# Patient Record
Sex: Female | Born: 1960 | Race: White | Hispanic: No | Marital: Married | Smoking: Never smoker
Health system: Southern US, Community
[De-identification: ages and names within clinical notes are randomized; demographics above are authoritative.]

## PROBLEM LIST (undated history)

## (undated) DIAGNOSIS — F329 Major depressive disorder, single episode, unspecified: Secondary | ICD-10-CM

## (undated) DIAGNOSIS — F419 Anxiety disorder, unspecified: Secondary | ICD-10-CM

## (undated) DIAGNOSIS — E039 Hypothyroidism, unspecified: Secondary | ICD-10-CM

## (undated) DIAGNOSIS — F32A Depression, unspecified: Secondary | ICD-10-CM

## (undated) HISTORY — DX: Anxiety disorder, unspecified: F41.9

## (undated) HISTORY — DX: Hypothyroidism, unspecified: E03.9

## (undated) HISTORY — DX: Major depressive disorder, single episode, unspecified: F32.9

## (undated) HISTORY — DX: Depression, unspecified: F32.A

---

## 1999-10-04 ENCOUNTER — Encounter: Admission: RE | Admit: 1999-10-04 | Discharge: 1999-10-04 | Payer: Self-pay | Admitting: *Deleted

## 1999-10-04 ENCOUNTER — Encounter: Payer: Self-pay | Admitting: *Deleted

## 2000-01-09 ENCOUNTER — Other Ambulatory Visit: Admission: RE | Admit: 2000-01-09 | Discharge: 2000-01-09 | Payer: Self-pay | Admitting: *Deleted

## 2002-12-16 ENCOUNTER — Encounter: Admission: RE | Admit: 2002-12-16 | Discharge: 2002-12-16 | Payer: Self-pay | Admitting: Women's Health

## 2002-12-16 ENCOUNTER — Encounter: Payer: Self-pay | Admitting: Obstetrics and Gynecology

## 2002-12-23 ENCOUNTER — Other Ambulatory Visit: Admission: RE | Admit: 2002-12-23 | Discharge: 2002-12-23 | Payer: Self-pay | Admitting: *Deleted

## 2003-12-22 ENCOUNTER — Encounter: Admission: RE | Admit: 2003-12-22 | Discharge: 2003-12-22 | Payer: Self-pay | Admitting: Obstetrics and Gynecology

## 2003-12-27 ENCOUNTER — Other Ambulatory Visit: Admission: RE | Admit: 2003-12-27 | Discharge: 2003-12-27 | Payer: Self-pay | Admitting: Gynecology

## 2005-01-03 ENCOUNTER — Encounter: Admission: RE | Admit: 2005-01-03 | Discharge: 2005-01-03 | Payer: Self-pay | Admitting: Obstetrics and Gynecology

## 2005-12-31 ENCOUNTER — Other Ambulatory Visit: Admission: RE | Admit: 2005-12-31 | Discharge: 2005-12-31 | Payer: Self-pay | Admitting: Gynecology

## 2006-01-07 ENCOUNTER — Encounter: Admission: RE | Admit: 2006-01-07 | Discharge: 2006-01-07 | Payer: Self-pay | Admitting: Obstetrics and Gynecology

## 2007-01-06 ENCOUNTER — Other Ambulatory Visit: Admission: RE | Admit: 2007-01-06 | Discharge: 2007-01-06 | Payer: Self-pay | Admitting: Gynecology

## 2007-01-23 ENCOUNTER — Encounter: Admission: RE | Admit: 2007-01-23 | Discharge: 2007-01-23 | Payer: Self-pay | Admitting: Obstetrics and Gynecology

## 2008-01-08 ENCOUNTER — Other Ambulatory Visit: Admission: RE | Admit: 2008-01-08 | Discharge: 2008-01-08 | Payer: Self-pay | Admitting: Gynecology

## 2008-01-26 ENCOUNTER — Encounter: Admission: RE | Admit: 2008-01-26 | Discharge: 2008-01-26 | Payer: Self-pay | Admitting: Obstetrics and Gynecology

## 2008-12-20 ENCOUNTER — Other Ambulatory Visit: Admission: RE | Admit: 2008-12-20 | Discharge: 2008-12-20 | Payer: Self-pay | Admitting: Gynecology

## 2009-01-20 ENCOUNTER — Encounter: Payer: Self-pay | Admitting: Women's Health

## 2009-01-20 ENCOUNTER — Ambulatory Visit: Payer: Self-pay | Admitting: Women's Health

## 2009-01-25 ENCOUNTER — Emergency Department (HOSPITAL_COMMUNITY): Admission: EM | Admit: 2009-01-25 | Discharge: 2009-01-26 | Payer: Self-pay | Admitting: Emergency Medicine

## 2009-03-21 ENCOUNTER — Encounter: Admission: RE | Admit: 2009-03-21 | Discharge: 2009-03-21 | Payer: Self-pay | Admitting: Obstetrics and Gynecology

## 2009-05-17 ENCOUNTER — Ambulatory Visit: Payer: Self-pay | Admitting: Women's Health

## 2009-07-26 ENCOUNTER — Ambulatory Visit: Payer: Self-pay | Admitting: Women's Health

## 2010-01-23 ENCOUNTER — Other Ambulatory Visit: Admission: RE | Admit: 2010-01-23 | Discharge: 2010-01-23 | Payer: Self-pay | Admitting: Gynecology

## 2010-01-23 ENCOUNTER — Ambulatory Visit: Payer: Self-pay | Admitting: Women's Health

## 2010-07-05 ENCOUNTER — Ambulatory Visit: Payer: Self-pay | Admitting: Women's Health

## 2011-02-05 ENCOUNTER — Other Ambulatory Visit (HOSPITAL_COMMUNITY)
Admission: RE | Admit: 2011-02-05 | Discharge: 2011-02-05 | Disposition: A | Discharge status: Home / Self Care | Payer: PRIVATE HEALTH INSURANCE | Source: Ambulatory Visit | Attending: Gynecology | Admitting: Gynecology

## 2011-02-05 ENCOUNTER — Other Ambulatory Visit: Payer: Self-pay | Admitting: Women's Health

## 2011-02-05 ENCOUNTER — Encounter (INDEPENDENT_AMBULATORY_CARE_PROVIDER_SITE_OTHER): Payer: PRIVATE HEALTH INSURANCE | Admitting: Women's Health

## 2011-02-05 DIAGNOSIS — R823 Hemoglobinuria: Secondary | ICD-10-CM

## 2011-02-05 DIAGNOSIS — Z01419 Encounter for gynecological examination (general) (routine) without abnormal findings: Secondary | ICD-10-CM

## 2011-02-05 DIAGNOSIS — Z833 Family history of diabetes mellitus: Secondary | ICD-10-CM

## 2011-02-05 DIAGNOSIS — Z13220 Encounter for screening for lipoid disorders: Secondary | ICD-10-CM

## 2011-02-05 DIAGNOSIS — E039 Hypothyroidism, unspecified: Secondary | ICD-10-CM

## 2011-02-05 DIAGNOSIS — Z124 Encounter for screening for malignant neoplasm of cervix: Secondary | ICD-10-CM | POA: Insufficient documentation

## 2011-04-03 LAB — URINE MICROSCOPIC-ADD ON

## 2011-04-03 LAB — DIFFERENTIAL
Eosinophils Relative: 3 % (ref 0–5)
Lymphocytes Relative: 19 % (ref 12–46)
Lymphs Abs: 1.5 10*3/uL (ref 0.7–4.0)
Monocytes Absolute: 0.5 10*3/uL (ref 0.1–1.0)
Monocytes Relative: 7 % (ref 3–12)

## 2011-04-03 LAB — POCT I-STAT, CHEM 8
BUN: 14 mg/dL (ref 6–23)
Creatinine, Ser: 0.9 mg/dL (ref 0.4–1.2)
Glucose, Bld: 145 mg/dL — ABNORMAL HIGH (ref 70–99)
Sodium: 139 mEq/L (ref 135–145)
TCO2: 24 mmol/L (ref 0–100)

## 2011-04-03 LAB — URINALYSIS, ROUTINE W REFLEX MICROSCOPIC
Glucose, UA: NEGATIVE mg/dL
Hgb urine dipstick: NEGATIVE
Protein, ur: 30 mg/dL — AB

## 2011-04-03 LAB — CBC
HCT: 38.1 % (ref 36.0–46.0)
Hemoglobin: 13.3 g/dL (ref 12.0–15.0)
Platelets: 199 10*3/uL (ref 150–400)
RBC: 4.06 MIL/uL (ref 3.87–5.11)
WBC: 8 10*3/uL (ref 4.0–10.5)

## 2011-04-03 LAB — POCT PREGNANCY, URINE: Preg Test, Ur: NEGATIVE

## 2012-01-31 DIAGNOSIS — F419 Anxiety disorder, unspecified: Secondary | ICD-10-CM | POA: Insufficient documentation

## 2012-01-31 DIAGNOSIS — F329 Major depressive disorder, single episode, unspecified: Secondary | ICD-10-CM | POA: Insufficient documentation

## 2012-01-31 DIAGNOSIS — E039 Hypothyroidism, unspecified: Secondary | ICD-10-CM | POA: Insufficient documentation

## 2012-02-07 ENCOUNTER — Other Ambulatory Visit (HOSPITAL_COMMUNITY)
Admission: RE | Admit: 2012-02-07 | Discharge: 2012-02-07 | Disposition: A | Discharge status: Home / Self Care | Payer: PRIVATE HEALTH INSURANCE | Source: Ambulatory Visit | Attending: Obstetrics and Gynecology | Admitting: Obstetrics and Gynecology

## 2012-02-07 ENCOUNTER — Other Ambulatory Visit: Payer: Self-pay | Admitting: Women's Health

## 2012-02-07 ENCOUNTER — Ambulatory Visit (INDEPENDENT_AMBULATORY_CARE_PROVIDER_SITE_OTHER): Payer: PRIVATE HEALTH INSURANCE | Admitting: Women's Health

## 2012-02-07 ENCOUNTER — Encounter: Payer: Self-pay | Admitting: Women's Health

## 2012-02-07 VITALS — BP 128/74 | Ht 64.5 in | Wt 173.0 lb

## 2012-02-07 DIAGNOSIS — Z780 Asymptomatic menopausal state: Secondary | ICD-10-CM

## 2012-02-07 DIAGNOSIS — Z01419 Encounter for gynecological examination (general) (routine) without abnormal findings: Secondary | ICD-10-CM | POA: Insufficient documentation

## 2012-02-07 DIAGNOSIS — E039 Hypothyroidism, unspecified: Secondary | ICD-10-CM

## 2012-02-07 DIAGNOSIS — Z13220 Encounter for screening for lipoid disorders: Secondary | ICD-10-CM

## 2012-02-07 DIAGNOSIS — Z833 Family history of diabetes mellitus: Secondary | ICD-10-CM

## 2012-02-07 DIAGNOSIS — F329 Major depressive disorder, single episode, unspecified: Secondary | ICD-10-CM

## 2012-02-07 LAB — CBC WITH DIFFERENTIAL/PLATELET
Basophils Absolute: 0 10*3/uL (ref 0.0–0.1)
Basophils Relative: 1 % (ref 0–1)
Eosinophils Absolute: 0.2 10*3/uL (ref 0.0–0.7)
Eosinophils Relative: 5 % (ref 0–5)
MCH: 31.4 pg (ref 26.0–34.0)
MCV: 97.9 fL (ref 78.0–100.0)
Platelets: 231 10*3/uL (ref 150–400)
RDW: 12.6 % (ref 11.5–15.5)
WBC: 5 10*3/uL (ref 4.0–10.5)

## 2012-02-07 LAB — URINALYSIS W MICROSCOPIC + REFLEX CULTURE
Casts: NONE SEEN
Glucose, UA: NEGATIVE mg/dL
Leukocytes, UA: NEGATIVE
Protein, ur: NEGATIVE mg/dL
pH: 5.5 (ref 5.0–8.0)

## 2012-02-07 LAB — TSH: TSH: 2.822 u[IU]/mL (ref 0.350–4.500)

## 2012-02-07 MED ORDER — FLUOXETINE HCL 20 MG PO CAPS: 20.0000 mg | ORAL_CAPSULE | Freq: Every day | ORAL | Status: DC

## 2012-02-07 MED ORDER — LEVOTHYROXINE SODIUM 112 MCG PO TABS: 112.0000 ug | ORAL_TABLET | Freq: Every day | ORAL | Status: DC

## 2012-02-07 NOTE — Progress Notes (Signed)
Donna Wheeler 25-Jun-1961 [id number]    History:    The patient presents for annual exam.  Postmenopausal with no bleeding or HRT. Hypothyroid on Synthroid 112 with stable TSH. History of anxiety/depression stable on Prozac 20 mg daily. History of normal paps and mammograms, overdue, last mammogram 2010.   Past medical history, past surgical history, family history and social history were all reviewed and documented in the EPIC chart.   ROS:  A  ROS was performed and pertinent positives and negatives are included in the history.  Exam:  Filed Vitals:   02/07/12 0804  BP: 128/74    General appearance:  Normal Head/Neck:  Normal, without cervical or supraclavicular adenopathy. Thyroid:  Symmetrical, normal in size, without palpable masses or nodularity. Respiratory  Effort:  Normal  Auscultation:  Clear without wheezing or rhonchi Cardiovascular  Auscultation:  Regular rate, without rubs, murmurs or gallops  Edema/varicosities:  Not grossly evident Abdominal  Soft,nontender, without masses, guarding or rebound.  Liver/spleen:  No organomegaly noted  Hernia:  None appreciated  Skin  Inspection:  Grossly normal  Palpation:  Grossly normal Neurologic/psychiatric  Orientation:  Normal with appropriate conversation.  Mood/affect:  Normal  Genitourinary    Breasts: Examined lying and sitting.     Right: Without masses, retractions, discharge or axillary adenopathy.     Left: Without masses, retractions, discharge or axillary adenopathy.   Inguinal/mons:  Normal without inguinal adenopathy  External genitalia:  Normal  BUS/Urethra/Skene's glands:  Normal  Bladder:  Normal  Vagina:  Normal  Cervix:  Normal  Uterus:   normal in size, shape and contour.  Midline and mobile  Adnexa/parametria:     Rt: Without masses or tenderness.   Lt: Without masses or tenderness.  Anus and perineum: Normal  Digital rectal exam: Normal sphincter tone without palpated masses or  tenderness  Assessment/Plan:  50 y.o.MWF G1P1  for annual exam with no complaints.  Normal postmenopausal exam with no bleeding or HRT Hypothyroid  Anxiety/depression stable Prozac 20 daily  Plan: Prozac 20 g by mouth daily prescription, proper use, need for regular exercise and leisure discussed. Declines need for counseling at this time. SBE's, schedule overdue mammogram ASAP, increased exercise, decrease calories for weight loss, vitamin D 2000 daily encouraged. Synthroid 112 mcg prescription, proper use, given and reviewed. DEXA, will schedule. CBC, glucose, lipid profile, UA and Pap    Harrington Challenger [facility], 8:43 AM 02/07/2012

## 2012-02-08 LAB — URINE CULTURE

## 2012-02-12 ENCOUNTER — Telehealth: Payer: Self-pay | Admitting: *Deleted

## 2012-02-12 MED ORDER — LEVOTHYROXINE SODIUM 112 MCG PO TABS: 112.0000 ug | ORAL_TABLET | Freq: Every day | ORAL | Status: DC

## 2012-02-12 NOTE — Telephone Encounter (Signed)
Pt called stating she only takes brand synthroid 112 mcg only. Pharmacy tried to give pt generic brand medication. New rx sent to [facility] for brand only. Pt informed as well.

## 2012-02-13 ENCOUNTER — Other Ambulatory Visit: Payer: Self-pay

## 2012-02-13 DIAGNOSIS — E7800 Pure hypercholesterolemia, unspecified: Secondary | ICD-10-CM

## 2012-03-03 ENCOUNTER — Other Ambulatory Visit: Payer: Self-pay | Admitting: Obstetrics and Gynecology

## 2012-03-03 DIAGNOSIS — Z1231 Encounter for screening mammogram for malignant neoplasm of breast: Secondary | ICD-10-CM

## 2012-03-11 ENCOUNTER — Ambulatory Visit
Admission: RE | Admit: 2012-03-11 | Discharge: 2012-03-11 | Disposition: A | Discharge status: Home / Self Care | Payer: PRIVATE HEALTH INSURANCE | Source: Ambulatory Visit | Attending: Obstetrics and Gynecology | Admitting: Obstetrics and Gynecology

## 2012-03-11 DIAGNOSIS — Z1231 Encounter for screening mammogram for malignant neoplasm of breast: Secondary | ICD-10-CM

## 2013-02-25 ENCOUNTER — Ambulatory Visit (INDEPENDENT_AMBULATORY_CARE_PROVIDER_SITE_OTHER): Payer: PRIVATE HEALTH INSURANCE | Admitting: Women's Health

## 2013-02-25 ENCOUNTER — Encounter: Payer: Self-pay | Admitting: Women's Health

## 2013-02-25 VITALS — BP 124/80 | Ht 65.0 in | Wt 182.0 lb

## 2013-02-25 DIAGNOSIS — F4323 Adjustment disorder with mixed anxiety and depressed mood: Secondary | ICD-10-CM

## 2013-02-25 DIAGNOSIS — Z833 Family history of diabetes mellitus: Secondary | ICD-10-CM

## 2013-02-25 DIAGNOSIS — Z780 Asymptomatic menopausal state: Secondary | ICD-10-CM

## 2013-02-25 DIAGNOSIS — Z01419 Encounter for gynecological examination (general) (routine) without abnormal findings: Secondary | ICD-10-CM

## 2013-02-25 DIAGNOSIS — Z13220 Encounter for screening for lipoid disorders: Secondary | ICD-10-CM

## 2013-02-25 DIAGNOSIS — E039 Hypothyroidism, unspecified: Secondary | ICD-10-CM

## 2013-02-25 LAB — CBC WITH DIFFERENTIAL/PLATELET
Eosinophils Relative: 4 % (ref 0–5)
Lymphocytes Relative: 31 % (ref 12–46)
Lymphs Abs: 2 10*3/uL (ref 0.7–4.0)
MCV: 92.2 fL (ref 78.0–100.0)
Neutro Abs: 3.6 10*3/uL (ref 1.7–7.7)
Platelets: 241 10*3/uL (ref 150–400)
RBC: 4.38 MIL/uL (ref 3.87–5.11)
WBC: 6.4 10*3/uL (ref 4.0–10.5)

## 2013-02-25 LAB — LIPID PANEL
Cholesterol: 265 mg/dL — ABNORMAL HIGH (ref 0–200)
HDL: 51 mg/dL (ref >39)
LDL Cholesterol: 139 mg/dL — ABNORMAL HIGH (ref 0–99)
Total CHOL/HDL Ratio: 5.2 Ratio
Triglycerides: 373 mg/dL — ABNORMAL HIGH (ref <150)
VLDL: 75 mg/dL — ABNORMAL HIGH (ref 0–40)

## 2013-02-25 LAB — GLUCOSE, RANDOM: Glucose, Bld: 87 mg/dL (ref 70–99)

## 2013-02-25 MED ORDER — VENLAFAXINE HCL ER 37.5 MG PO CP24: ORAL_CAPSULE | ORAL | Status: DC

## 2013-02-25 MED ORDER — LEVOTHYROXINE SODIUM 112 MCG PO TABS: 112.0000 ug | ORAL_TABLET | Freq: Every day | ORAL | Status: DC

## 2013-02-25 NOTE — Progress Notes (Signed)
Donna Wheeler February 02, 1961 [id number]    History:    The patient presents for annual exam.  Postmenopausal on no HRT. Hypothyroid currently on Synthroid 112. History of anxiety/depression on Prozac 20 mg, would like to try Effexor. History of normal Paps and mammograms.   Past medical history, past surgical history, family history and social history were all reviewed and documented in the EPIC chart. Works at [employer]. [name] 20 attending [employer] and working.   ROS:  A  ROS was performed and pertinent positives and negatives are included in the history.  Exam:  Filed Vitals:   02/25/13 1409  BP: 124/80    General appearance:  Normal Head/Neck:  Normal, without cervical or supraclavicular adenopathy. Thyroid:  Symmetrical, normal in size, without palpable masses or nodularity. Respiratory  Effort:  Normal  Auscultation:  Clear without wheezing or rhonchi Cardiovascular  Auscultation:  Regular rate, without rubs, murmurs or gallops  Edema/varicosities:  Not grossly evident Abdominal  Soft,nontender, without masses, guarding or rebound.  Liver/spleen:  No organomegaly noted  Hernia:  None appreciated  Skin  Inspection:  Grossly normal  Palpation:  Grossly normal Neurologic/psychiatric  Orientation:  Normal with appropriate conversation.  Mood/affect:  Normal  Genitourinary    Breasts: Examined lying and sitting.     Right: Without masses, retractions, discharge or axillary adenopathy.     Left: Without masses, retractions, discharge or axillary adenopathy.   Inguinal/mons:  Normal without inguinal adenopathy  External genitalia:  Normal  BUS/Urethra/Skene's glands:  Normal  Bladder:  Normal  Vagina:  Normal  Cervix:  Normal  Uterus:   normal in size, shape and contour.  Midline and mobile  Adnexa/parametria:     Rt: Without masses or tenderness.   Lt: Without masses or tenderness.  Anus and perineum: Normal  Digital rectal exam: Normal sphincter tone  without palpated masses or tenderness  Assessment/Plan:  52 y.o. M. WF G.1 P1 for annual exam.     Hypothyroid-Synthroid 112 Anxiety/depression requesting change of medication Normal postmenopausal exam/ no bleeding/no HRT  Plan: CBC, glucose, lipid panel, TSH, UA, Pap normal 01/2012, new screening guidelines reviewed. Synthroid 112 mcg prescription, proper use reviewed, triage based on results. Options reviewed, will try to wean down on Prozac 20 and add Effexor 37.5 XL 1 daily for several weeks increase to 75, declines need for counseling at this time, will call if symptoms do not improve. Colonoscopy, instructed to call to schedule with  [facility]. DEXA, will schedule here. SBE's, continue annual mammogram, calcium rich diet, vitamin D 2000 daily, exercise encouraged. Decrease calories for weight loss.    Harrington Challenger [facility], 5:39 PM 02/25/2013

## 2013-02-25 NOTE — Patient Instructions (Addendum)
1500 Calorie Diabetic Diet The 1500 calorie diabetic diet limits calories to 1500 each day. Following this diet and making healthy meal choices can help improve overall health. It controls blood glucose (sugar) levels and can also help lower blood pressure and cholesterol.  SERVING SIZES Measuring foods and serving sizes helps to make sure you are getting the right amount of food. The list below tells how big or small some common serving sizes are.  1 oz.........4 stacked dice.  3 oz.........Deck of cards.  1 tsp........Tip of little finger.  1 tbs........Thumb.  2 tbs........Golf ball.   cup.......Half of a fist.  1 cup........A fist. GUIDELINES FOR CHOOSING FOODS The goal of this diet is to eat a variety of foods and limit calories to 1500 each day. This can be done by choosing foods that are low in calories and fat. The diet also suggests eating small amounts of food frequently. Doing this helps control your blood glucose levels, so they do not get too high or too low. Each meal or snack may include a protein food source to help you feel more satisfied. Try to eat about the same amount of food around the same time each day. This includes weekend days, travel days, and days off work. Space your meals about 4 to 5 hours apart, and add a snack between them, if you wish.  For example, a daily food plan could include breakfast, a morning snack, lunch, dinner, and an evening snack. Healthy meals and snacks have different types of foods, including whole grains, vegetables, fruits, lean meats, poultry, fish, and dairy products. As you plan your meals, select a variety of foods. Choose from the bread and starch, vegetable, fruit, dairy, and meat/protein groups. Examples of foods from each group are listed below, with their suggested serving sizes. Use measuring cups and spoons to become familiar with what a healthy portion looks like. Bread and Starch Each serving equals 15 grams of  carbohydrate.  1 slice bread.   bagel.   cup cold cereal (unsweetened).   cup hot cereal or mashed potatoes.  1 small potato (size of a computer mouse).   cup cooked pasta or rice.   English muffin.  1 cup broth-based soup.  3 cups of popcorn.  4 to 6 whole-wheat crackers.   cup cooked beans, peas, or corn. Vegetables Each serving equals 5 grams of carbohydrate.   cup cooked vegetables.  1 cup raw vegetables.   cup tomato or vegetable juice. Fruit Each serving equals 15 grams of carbohydrate.  1 small apple or orange.  1  cup watermelon or strawberries.   cup applesauce (no sugar added).  2 tbs raisins.   banana.   cup canned fruit, packed in water or in its own juice.   cup unsweetened fruit juice. Dairy Each serving equals 12 to 15 grams of carbohydrate.  1 cup fat-free milk.  6 oz artificially sweetened yogurt or plain yogurt.  1 cup low-fat buttermilk.  1 cup soy milk.  1 cup almond milk. Meat/Protein  1 large egg.  2 to 3 oz meat, poultry, or fish.   cup low-fat cottage cheese.  1 tbs peanut butter.  1 oz low-fat cheese.   cup tuna, packed in water.   cup tofu. Fat  1 tsp oil.  1 tsp trans-fat-free margarine.  1 tsp butter.  1 tsp mayonnaise.  2 tbs avocado.  1 tbs salad dressing.  1 tbs cream cheese.  2 tbs sour cream. SAMPLE 1500 CALORIE DIET   PLAN Breakfast   whole-wheat English muffin (1 carb serving).  1 tsp trans-fat-free margarine.  1 scrambled egg.  1 cup fat-free milk (1 carb serving).  1 small orange (1 carb serving). Lunch  Chicken wrap.  1 whole-wheat tortilla, 8-inch (1 carb servings).  2 oz chicken breast, sliced.  2 tbs low-fat salad dressing, such as [place].   cup shredded lettuce.  2 slices tomato.   cup carrot sticks.  1 small apple (1 carb serving). Afternoon Snack  3 graham cracker squares (1 carb serving).  1 tbs peanut butter. Dinner  2 oz lean  pork chop, broiled.  1 cup brown rice (3 carb servings).   cup steamed carrots.   cup green beans.  1 cup fat-free milk (1 carb serving).  1 tsp trans-fat-free margarine. Evening Snack   cup low-fat cottage cheese.  1 small peach or pear, sliced (or  cup canned in water) (1 carb serving). MEAL PLAN You can use this worksheet to help you make a daily meal plan based on the 1500 calorie diabetic diet suggestions. If you are using this plan to help you control your blood glucose, you may interchange carbohydrate containing foods (dairy, starches, and fruits). Select a variety of fresh foods of varying colors and flavors. The total amount of carbohydrate in your meals or snacks is more important than making sure you include all of the food groups every time you eat. You can choose from approximately this many of the following foods to build your day's meals:  6 Starches.  3 Vegetables.  2 Fruits.  2 Dairy.  4 to 6 oz Meat/Protein.  Up to 3 Fats. Your dietician can use this worksheet to help you decide how many servings and which types of foods are right for you. BREAKFAST Food Group and Servings / Food Choice Starch _________________________________________________________ Dairy __________________________________________________________ Fruit ___________________________________________________________ Meat/Protein____________________________________________________ Fat ____________________________________________________________ LUNCH Food Group and Servings / Food Choice  Starch _________________________________________________________ Meat/Protein ___________________________________________________ Vegetables _____________________________________________________ Fruit __________________________________________________________ Dairy __________________________________________________________ Fat ____________________________________________________________ Aura Fey Food Group and Servings / Food Choice Dairy __________________________________________________________ Starch _________________________________________________________ Meat/Protein____________________________________________________ Zada Girt ___________________________________________________________ Laural Golden Food Group and Servings / Food Choice Starch _________________________________________________________ Meat/Protein ___________________________________________________ Dairy __________________________________________________________ Vegetable ______________________________________________________ Fruit ___________________________________________________________ Fat ____________________________________________________________ Lollie Sails Food Group and Servings / Food Choice Fruit ___________________________________________________________ Meat/Protein ____________________________________________________ Dairy __________________________________________________________ Starch __________________________________________________________ DAILY TOTALS Starches _________________________ Vegetables _______________________ Fruits ____________________________ Dairy ____________________________ Meat/Protein_____________________ Fats _____________________________ Document Released: 06/25/2005 Document Revised: 02/25/2012 Document Reviewed: 10/20/2009 Exit[employer] Patient Information 2013 [employer]   [facility] or Dr Loreta [address] for colonoscopy Vit D 2000 daily Bone density/ Dexa

## 2013-02-26 ENCOUNTER — Telehealth: Payer: Self-pay

## 2013-02-26 ENCOUNTER — Other Ambulatory Visit: Payer: Self-pay | Admitting: Women's Health

## 2013-02-26 DIAGNOSIS — F4323 Adjustment disorder with mixed anxiety and depressed mood: Secondary | ICD-10-CM

## 2013-02-26 DIAGNOSIS — E039 Hypothyroidism, unspecified: Secondary | ICD-10-CM

## 2013-02-26 LAB — URINALYSIS W MICROSCOPIC + REFLEX CULTURE
Bilirubin Urine: NEGATIVE
Crystals: NONE SEEN
Leukocytes, UA: NEGATIVE
Nitrite: NEGATIVE
Protein, ur: NEGATIVE mg/dL
Specific Gravity, Urine: 1.012 (ref 1.005–1.030)
Squamous Epithelial / LPF: NONE SEEN
Urobilinogen, UA: 0.2 mg/dL (ref 0.0–1.0)

## 2013-02-26 MED ORDER — LEVOTHYROXINE SODIUM 112 MCG PO TABS: 112.0000 ug | ORAL_TABLET | Freq: Every day | ORAL | Status: DC

## 2013-02-26 MED ORDER — VENLAFAXINE HCL ER 37.5 MG PO CP24: ORAL_CAPSULE | ORAL | Status: DC

## 2013-02-26 NOTE — Telephone Encounter (Signed)
Patient said several years ago you told her brand Synthroid was better and she has taken the brand for the last few years.  This time she went to get it and they had filled it with generic (as you had prescribed it).  Patient told them it was supposed to be brand and they changed it for her.  She said it is expensive and she would like to take the less expensive generic but only if you thought it was okay.  She asked me to check with you.

## 2013-02-26 NOTE — Telephone Encounter (Addendum)
She did not say unable to afford. You had actually prescribed generic.   She wants to get the brand if you feel it is worth it and actually had him fill it that way.

## 2013-02-26 NOTE — Telephone Encounter (Signed)
Yes branded is more predictable/usually better, but if unable to afford could try the levothyroxin since she has been on Synthroid now for several years.

## 2013-02-27 ENCOUNTER — Encounter: Payer: Self-pay | Admitting: Women's Health

## 2013-02-27 NOTE — Telephone Encounter (Signed)
I called patient to relay below and patient said [place] had already called her yesterday and discussed. They decided patient will try the generic with her next refill and see how she feels on it.

## 2013-03-23 ENCOUNTER — Other Ambulatory Visit: Payer: Self-pay | Admitting: Women's Health

## 2013-03-23 ENCOUNTER — Telehealth: Payer: Self-pay | Admitting: *Deleted

## 2013-03-23 DIAGNOSIS — E039 Hypothyroidism, unspecified: Secondary | ICD-10-CM

## 2013-03-23 MED ORDER — LEVOTHYROXINE SODIUM 112 MCG PO TABS: 112.0000 ug | ORAL_TABLET | Freq: Every day | ORAL | Status: DC

## 2013-03-23 NOTE — Telephone Encounter (Signed)
Pt called requesting generic synthroid 112 mcg. Per telephone encounter on 02/26/13 rx will be sent.

## 2013-03-23 NOTE — Telephone Encounter (Signed)
New rx was sent by [facility] for generic

## 2013-07-30 ENCOUNTER — Telehealth: Payer: Self-pay | Admitting: *Deleted

## 2013-07-30 NOTE — Telephone Encounter (Signed)
Pt aware you are out of the office) pt would like to switch back on Prozac 20 mg tablet, she is currently taking Effexor 37.5 mg. Pt said she felt better on Prozac. Please advise

## 2013-08-03 ENCOUNTER — Other Ambulatory Visit: Payer: Self-pay | Admitting: Women's Health

## 2013-08-03 MED ORDER — FLUOXETINE HCL 20 MG PO CAPS: 20.0000 mg | ORAL_CAPSULE | Freq: Every day | ORAL | Status: DC

## 2013-08-03 NOTE — Telephone Encounter (Signed)
Telephone call to review request. Had been on Prozac 20 for several years doing well, had wanted to try Effexor. States does not feel as well on that would like to go back to Prozac. Will wean down off the Effexor and Prozac. Reviewed if not feeling better in several weeks to call back. Has had counseling in the past, and denies need for at this time.

## 2013-08-14 ENCOUNTER — Ambulatory Visit
Admission: RE | Admit: 2013-08-14 | Discharge: 2013-08-14 | Disposition: A | Discharge status: Home / Self Care | Payer: PRIVATE HEALTH INSURANCE | Source: Ambulatory Visit | Attending: Family Medicine | Admitting: Family Medicine

## 2013-08-14 ENCOUNTER — Other Ambulatory Visit: Payer: Self-pay | Admitting: Family Medicine

## 2013-08-14 DIAGNOSIS — M79671 Pain in right foot: Secondary | ICD-10-CM

## 2014-02-26 ENCOUNTER — Encounter: Payer: Self-pay | Admitting: Women's Health

## 2014-03-14 ENCOUNTER — Other Ambulatory Visit: Payer: Self-pay | Admitting: Women's Health

## 2014-03-15 NOTE — Telephone Encounter (Signed)
Has yearly exam schedule 03/26/14 with you.

## 2014-03-15 NOTE — Telephone Encounter (Signed)
Ok, sorry didn't see your note  --Donna Wheeler

## 2014-03-15 NOTE — Telephone Encounter (Signed)
Ok for refill but have her schedule annual 

## 2014-03-26 ENCOUNTER — Other Ambulatory Visit (HOSPITAL_COMMUNITY)
Admission: RE | Admit: 2014-03-26 | Discharge: 2014-03-26 | Disposition: A | Discharge status: Home / Self Care | Payer: PRIVATE HEALTH INSURANCE | Source: Ambulatory Visit | Attending: Gynecology | Admitting: Gynecology

## 2014-03-26 ENCOUNTER — Encounter: Payer: Self-pay | Admitting: Women's Health

## 2014-03-26 ENCOUNTER — Ambulatory Visit (INDEPENDENT_AMBULATORY_CARE_PROVIDER_SITE_OTHER): Payer: PRIVATE HEALTH INSURANCE | Admitting: Women's Health

## 2014-03-26 VITALS — BP 120/76 | Ht 65.0 in | Wt 168.0 lb

## 2014-03-26 DIAGNOSIS — Z01419 Encounter for gynecological examination (general) (routine) without abnormal findings: Secondary | ICD-10-CM | POA: Insufficient documentation

## 2014-03-26 DIAGNOSIS — E039 Hypothyroidism, unspecified: Secondary | ICD-10-CM

## 2014-03-26 DIAGNOSIS — Z13220 Encounter for screening for lipoid disorders: Secondary | ICD-10-CM

## 2014-03-26 DIAGNOSIS — F411 Generalized anxiety disorder: Secondary | ICD-10-CM

## 2014-03-26 LAB — COMPREHENSIVE METABOLIC PANEL
ALK PHOS: 80 U/L (ref 39–117)
ALT: 18 U/L (ref 0–35)
AST: 21 U/L (ref 0–37)
Albumin: 4.1 g/dL (ref 3.5–5.2)
BILIRUBIN TOTAL: 0.7 mg/dL (ref 0.2–1.2)
BUN: 16 mg/dL (ref 6–23)
CO2: 26 mEq/L (ref 19–32)
CREATININE: 0.76 mg/dL (ref 0.50–1.10)
Calcium: 9.5 mg/dL (ref 8.4–10.5)
Chloride: 104 mEq/L (ref 96–112)
Glucose, Bld: 81 mg/dL (ref 70–99)
Potassium: 4.5 mEq/L (ref 3.5–5.3)
Sodium: 140 mEq/L (ref 135–145)
Total Protein: 6.9 g/dL (ref 6.0–8.3)

## 2014-03-26 LAB — URINALYSIS W MICROSCOPIC + REFLEX CULTURE
BACTERIA UA: NONE SEEN
BILIRUBIN URINE: NEGATIVE
CASTS: NONE SEEN
CRYSTALS: NONE SEEN
Glucose, UA: NEGATIVE mg/dL
Hgb urine dipstick: NEGATIVE
KETONES UR: NEGATIVE mg/dL
Leukocytes, UA: NEGATIVE
NITRITE: NEGATIVE
PH: 6.5 (ref 5.0–8.0)
Protein, ur: NEGATIVE mg/dL
SPECIFIC GRAVITY, URINE: 1.024 (ref 1.005–1.030)
SQUAMOUS EPITHELIAL / LPF: NONE SEEN
Urobilinogen, UA: 1 mg/dL (ref 0.0–1.0)

## 2014-03-26 LAB — CBC WITH DIFFERENTIAL/PLATELET
BASOS ABS: 0.1 10*3/uL (ref 0.0–0.1)
BASOS PCT: 1 % (ref 0–1)
EOS ABS: 0.5 10*3/uL (ref 0.0–0.7)
EOS PCT: 9 % — AB (ref 0–5)
HEMATOCRIT: 42.1 % (ref 36.0–46.0)
Hemoglobin: 14.6 g/dL (ref 12.0–15.0)
Lymphocytes Relative: 35 % (ref 12–46)
Lymphs Abs: 1.9 10*3/uL (ref 0.7–4.0)
MCH: 32.1 pg (ref 26.0–34.0)
MCHC: 34.7 g/dL (ref 30.0–36.0)
MCV: 92.5 fL (ref 78.0–100.0)
MONO ABS: 0.4 10*3/uL (ref 0.1–1.0)
Monocytes Relative: 7 % (ref 3–12)
NEUTROS ABS: 2.5 10*3/uL (ref 1.7–7.7)
Neutrophils Relative %: 48 % (ref 43–77)
Platelets: 211 10*3/uL (ref 150–400)
RBC: 4.55 MIL/uL (ref 3.87–5.11)
RDW: 13.7 % (ref 11.5–15.5)
WBC: 5.3 10*3/uL (ref 4.0–10.5)

## 2014-03-26 LAB — LIPID PANEL
CHOL/HDL RATIO: 5.5 ratio
Cholesterol: 258 mg/dL — ABNORMAL HIGH (ref 0–200)
HDL: 47 mg/dL (ref >39)
LDL Cholesterol: 166 mg/dL — ABNORMAL HIGH (ref 0–99)
Triglycerides: 223 mg/dL — ABNORMAL HIGH (ref <150)
VLDL: 45 mg/dL — AB (ref 0–40)

## 2014-03-26 MED ORDER — ESCITALOPRAM OXALATE 20 MG PO TABS: 20.0000 mg | ORAL_TABLET | Freq: Every day | ORAL | Status: DC

## 2014-03-26 MED ORDER — LEVOTHYROXINE SODIUM 112 MCG PO TABS: ORAL_TABLET | ORAL | Status: DC

## 2014-03-26 NOTE — Progress Notes (Signed)
Donna BongoSusan L Wheeler 03-Dec-1961 [id number]    History:    Presents for annual exam.  Postmenopausal/no HRT/no bleeding. Normal Pap and mammogram history. Hypothyroid on Synthroid. Has had problems with anxiety and depression has tried Effexor and has been on Prozac for years, would like to try something different.   Has not had a colonoscopy or  DEXA.  Past medical history, past surgical history, family history and social history were all reviewed and documented in the EPIC chart. Works at [employer] in the [employer]house[employer] department. Donna Wheeler 22 doing well.  ROS:  A  ROS was performed and pertinent positives and negatives are included.  Exam:  Filed Vitals:   03/26/14 0947  BP: 120/76    General appearance:  Normal Thyroid:  Symmetrical, normal in size, without palpable masses or nodularity. Respiratory  Auscultation:  Clear without wheezing or rhonchi Cardiovascular  Auscultation:  Regular rate, without rubs, murmurs or gallops  Edema/varicosities:  Not grossly evident Abdominal  Soft,nontender, without masses, guarding or rebound.  Liver/spleen:  No organomegaly noted  Hernia:  None appreciated  Skin  Inspection:  Grossly normal   Breasts: Examined lying and sitting.     Right: Without masses, retractions, discharge or axillary adenopathy.     Left: Without masses, retractions, discharge or axillary adenopathy. Gentitourinary   Inguinal/mons:  Normal without inguinal adenopathy  External genitalia:  Normal  BUS/Urethra/Skene's glands:  Normal  Vagina:  Normal  Cervix:  Normal  Uterus:   normal in size, shape and contour.  Midline and mobile  Adnexa/parametria:     Rt: Without masses or tenderness.   Lt: Without masses or tenderness.  Anus and perineum: Normal  Digital rectal exam: Normal sphincter tone without palpated masses or tenderness  Assessment/Plan:  52 y.o.MWF G1P1  for annual exam.    Anxiety/depression  Normal GYN exam Hypothyroid  Plan: Continue  counseling, options reviewed will try Lexapro 10 mg for 2 weeks and then  increase to Lexapro 20 mg daily. Will call if no relief of symptoms. Aware of importance of increasing regular exercise, leisure activities, calcium rich diet, vitamin D 2000 daily. Reviewed importance of annual screening mammograms overdue, instructed to schedule. Screening colonoscopy reviewed instructed to schedule, DEXA will schedule here. Synthroid 112 prescription, proper use given and reviewed. CBC, TSH, lipid panel, glucose, UA, Pap Pap normal 2013, new screening guidelines reviewed.  Harrington Challengerancy J Ayianna Wheeler Donna Wheeler [facility] CenterWHNP, 12:53 PM 03/26/2014

## 2014-03-26 NOTE — Patient Instructions (Signed)
Health Recommendations for Postmenopausal Women Respected and ongoing research has looked at the most common causes of death, disability, and poor quality of life in postmenopausal women. The causes include heart disease, diseases of blood vessels, diabetes, depression, cancer, and bone loss (osteoporosis). Many things can be done to help lower the chances of developing these and other common problems: CARDIOVASCULAR DISEASE Heart Disease: A heart attack is a medical emergency. Know the signs and symptoms of a heart attack. Below are things women can do to reduce their risk for heart disease.   Do not smoke. If you smoke, quit.  Aim for a healthy weight. Being overweight causes many preventable deaths. Eat a healthy and balanced diet and drink an adequate amount of liquids.  Get moving. Make a commitment to be more physically active. Aim for 30 minutes of activity on most, if not all days of the week.  Eat for heart health. Choose a diet that is low in saturated fat and cholesterol and eliminate trans fat. Include whole grains, vegetables, and fruits. Read and understand the labels on food containers before buying.  Know your numbers. Ask your caregiver to check your blood pressure, cholesterol (total, HDL, LDL, triglycerides) and blood glucose. Work with your caregiver on improving your entire clinical picture.  High blood pressure. Limit or stop your table salt intake (try salt substitute and food seasonings). Avoid salty foods and drinks. Read labels on food containers before buying. Eating well and exercising can help control high blood pressure. STROKE  Stroke is a medical emergency. Stroke may be the result of a blood clot in a blood vessel in the brain or by a brain hemorrhage (bleeding). Know the signs and symptoms of a stroke. To lower the risk of developing a stroke:  Avoid fatty foods.  Quit smoking.  Control your diabetes, blood pressure, and irregular heart rate. THROMBOPHLEBITIS  (BLOOD CLOT) OF THE LEG  Becoming overweight and leading a stationary lifestyle may also contribute to developing blood clots. Controlling your diet and exercising will help lower the risk of developing blood clots. CANCER SCREENING  Breast Cancer: Take steps to reduce your risk of breast cancer.  You should practice "breast self-awareness." This means understanding the normal appearance and feel of your breasts and should include breast self-examination. Any changes detected, no matter how small, should be reported to your caregiver.  After age 40, you should have a clinical breast exam (CBE) every year.  Starting at age 40, you should consider having a mammogram (breast X-ray) every year.  If you have a family history of breast cancer, talk to your caregiver about genetic screening.  If you are at high risk for breast cancer, talk to your caregiver about having an MRI and a mammogram every year.  Intestinal or Stomach Cancer: Tests to consider are a rectal exam, fecal occult blood, sigmoidoscopy, and colonoscopy. Women who are high risk may need to be screened at an earlier age and more often.  Cervical Cancer:  Beginning at age 30, you should have a Pap test every 3 years as long as the past 3 Pap tests have been normal.  If you have had past treatment for cervical cancer or a condition that could lead to cancer, you need Pap tests and screening for cancer for at least 20 years after your treatment.  If you had a hysterectomy for a problem that was not cancer or a condition that could lead to cancer, then you no longer need Pap tests.    If you are between ages 65 and 70, and you have had normal Pap tests going back 10 years, you no longer need Pap tests.  If Pap tests have been discontinued, risk factors (such as a new sexual partner) need to be reassessed to determine if screening should be resumed.  Some medical problems can increase the chance of getting cervical cancer. In these  cases, your caregiver may recommend more frequent screening and Pap tests.  Uterine Cancer: If you have vaginal bleeding after reaching menopause, you should notify your caregiver.  Ovarian cancer: Other than yearly pelvic exams, there are no reliable tests available to screen for ovarian cancer at this time except for yearly pelvic exams.  Lung Cancer: Yearly chest X-rays can detect lung cancer and should be done on high risk women, such as cigarette smokers and women with chronic lung disease (emphysema).  Skin Cancer: A complete body skin exam should be done at your yearly examination. Avoid overexposure to the sun and ultraviolet light lamps. Use a strong sun block cream when in the sun. All of these things are important in lowering the risk of skin cancer. MENOPAUSE Menopause Symptoms: Hormone therapy products are effective for treating symptoms associated with menopause:  Moderate to severe hot flashes.  Night sweats.  Mood swings.  Headaches.  Tiredness.  Loss of sex drive.  Insomnia.  Other symptoms. Hormone replacement carries certain risks, especially in older women. Women who use or are thinking about using estrogen or estrogen with progestin treatments should discuss that with their caregiver. Your caregiver will help you understand the benefits and risks. The ideal dose of hormone replacement therapy is not known. The Food and Drug Administration (FDA) has concluded that hormone therapy should be used only at the lowest doses and for the shortest amount of time to reach treatment goals.  OSTEOPOROSIS Protecting Against Bone Loss and Preventing Fracture: If you use hormone therapy for prevention of bone loss (osteoporosis), the risks for bone loss must outweigh the risk of the therapy. Ask your caregiver about other medications known to be safe and effective for preventing bone loss and fractures. To guard against bone loss or fractures, the following is recommended:  If  you are less than age 50, take 1000 mg of calcium and at least 600 mg of Vitamin D per day.  If you are greater than age 50 but less than age 70, take 1200 mg of calcium and at least 600 mg of Vitamin D per day.  If you are greater than age 70, take 1200 mg of calcium and at least 800 mg of Vitamin D per day. Smoking and excessive alcohol intake increases the risk of osteoporosis. Eat foods rich in calcium and vitamin D and do weight bearing exercises several times a week as your caregiver suggests. DIABETES Diabetes Melitus: If you have Type I or Type 2 diabetes, you should keep your blood sugar under control with diet, exercise and recommended medication. Avoid too many sweets, starchy and fatty foods. Being overweight can make control more difficult. COGNITION AND MEMORY Cognition and Memory: Menopausal hormone therapy is not recommended for the prevention of cognitive disorders such as Alzheimer's disease or memory loss.  DEPRESSION  Depression may occur at any age, but is common in elderly women. The reasons may be because of physical, medical, social (loneliness), or financial problems and needs. If you are experiencing depression because of medical problems and control of symptoms, talk to your caregiver about this. Physical activity and   exercise may help with mood and sleep. Community and volunteer involvement may help your sense of value and worth. If you have depression and you feel that the problem is getting worse or becoming severe, talk to your caregiver about treatment options that are best for you. ACCIDENTS  Accidents are common and can be serious in the elderly woman. Prepare your house to prevent accidents. Eliminate throw rugs, place hand bars in the bath, shower and toilet areas. Avoid wearing high heeled shoes or walking on wet, snowy, and icy areas. Limit or stop driving if you have vision or hearing problems, or you feel you are unsteady with you movements and  reflexes. HEPATITIS C Hepatitis C is a type of viral infection affecting the liver. It is spread mainly through contact with blood from an infected person. It can be treated, but if left untreated, it can lead to severe liver damage over years. Many people who are infected do not know that the virus is in their blood. If you are a "baby-boomer", it is recommended that you have one screening test for Hepatitis C. IMMUNIZATIONS  Several immunizations are important to consider having during your senior years, including:   Tetanus, diptheria, and pertussis booster shot.  Influenza every year before the flu season begins.  Pneumonia vaccine.  Shingles vaccine.  Others as indicated based on your specific needs. Talk to your caregiver about these. Document Released: 01/25/2006 Document Revised: 11/19/2012 Document Reviewed: 09/20/2008 Exit[employer] Patient Information 2014 [employer].  

## 2014-03-27 LAB — TSH: TSH: 2.632 u[IU]/mL (ref 0.350–4.500)

## 2014-04-12 ENCOUNTER — Other Ambulatory Visit: Payer: Self-pay | Admitting: Women's Health

## 2014-05-20 ENCOUNTER — Telehealth: Payer: Self-pay | Admitting: *Deleted

## 2014-05-20 NOTE — Telephone Encounter (Signed)
Pt currently taking lexapro 20 mg requesting to start back on Prozac 20 mg. Pt doesn't feel the Lexapro is working well. Okay to switch? Please advise

## 2014-05-20 NOTE — Telephone Encounter (Signed)
Ok, prozac 20mg  daily, refill through next annual exam

## 2014-05-21 MED ORDER — FLUOXETINE HCL 20 MG PO CAPS: 20.0000 mg | ORAL_CAPSULE | Freq: Every day | ORAL | Status: DC

## 2014-05-21 NOTE — Telephone Encounter (Signed)
Left on pt voicemail, rx sent 

## 2014-10-18 ENCOUNTER — Encounter: Payer: Self-pay | Admitting: Women's Health

## 2015-05-10 ENCOUNTER — Encounter: Payer: Self-pay | Admitting: Women's Health

## 2015-05-10 ENCOUNTER — Ambulatory Visit (INDEPENDENT_AMBULATORY_CARE_PROVIDER_SITE_OTHER): Payer: 59 | Admitting: Women's Health

## 2015-05-10 VITALS — BP 120/78 | Ht 65.0 in | Wt 168.0 lb

## 2015-05-10 DIAGNOSIS — Z01419 Encounter for gynecological examination (general) (routine) without abnormal findings: Secondary | ICD-10-CM | POA: Diagnosis not present

## 2015-05-10 DIAGNOSIS — F329 Major depressive disorder, single episode, unspecified: Secondary | ICD-10-CM

## 2015-05-10 DIAGNOSIS — Z23 Encounter for immunization: Secondary | ICD-10-CM | POA: Diagnosis not present

## 2015-05-10 DIAGNOSIS — E038 Other specified hypothyroidism: Secondary | ICD-10-CM | POA: Diagnosis not present

## 2015-05-10 DIAGNOSIS — Z13220 Encounter for screening for lipoid disorders: Secondary | ICD-10-CM

## 2015-05-10 DIAGNOSIS — F32A Depression, unspecified: Secondary | ICD-10-CM

## 2015-05-10 LAB — CBC WITH DIFFERENTIAL/PLATELET
BASOS ABS: 0 10*3/uL (ref 0.0–0.1)
BASOS PCT: 1 % (ref 0–1)
EOS PCT: 4 % (ref 0–5)
Eosinophils Absolute: 0.2 10*3/uL (ref 0.0–0.7)
HCT: 42 % (ref 36.0–46.0)
HEMOGLOBIN: 14.3 g/dL (ref 12.0–15.0)
Lymphocytes Relative: 28 % (ref 12–46)
Lymphs Abs: 1.3 10*3/uL (ref 0.7–4.0)
MCH: 31.8 pg (ref 26.0–34.0)
MCHC: 34 g/dL (ref 30.0–36.0)
MCV: 93.5 fL (ref 78.0–100.0)
MPV: 10.4 fL (ref 8.6–12.4)
Monocytes Absolute: 0.4 10*3/uL (ref 0.1–1.0)
Monocytes Relative: 9 % (ref 3–12)
NEUTROS ABS: 2.8 10*3/uL (ref 1.7–7.7)
Neutrophils Relative %: 58 % (ref 43–77)
PLATELETS: 224 10*3/uL (ref 150–400)
RBC: 4.49 MIL/uL (ref 3.87–5.11)
RDW: 13.4 % (ref 11.5–15.5)
WBC: 4.8 10*3/uL (ref 4.0–10.5)

## 2015-05-10 LAB — COMPREHENSIVE METABOLIC PANEL
ALT: 23 U/L (ref 0–35)
AST: 25 U/L (ref 0–37)
Albumin: 4.5 g/dL (ref 3.5–5.2)
Alkaline Phosphatase: 78 U/L (ref 39–117)
BILIRUBIN TOTAL: 0.8 mg/dL (ref 0.2–1.2)
BUN: 18 mg/dL (ref 6–23)
CO2: 25 mEq/L (ref 19–32)
CREATININE: 0.77 mg/dL (ref 0.50–1.10)
Calcium: 9.6 mg/dL (ref 8.4–10.5)
Chloride: 103 mEq/L (ref 96–112)
GLUCOSE: 89 mg/dL (ref 70–99)
Potassium: 4.6 mEq/L (ref 3.5–5.3)
Sodium: 139 mEq/L (ref 135–145)
TOTAL PROTEIN: 7.2 g/dL (ref 6.0–8.3)

## 2015-05-10 LAB — LIPID PANEL
CHOLESTEROL: 244 mg/dL — AB (ref 0–200)
HDL: 56 mg/dL (ref >=46)
LDL Cholesterol: 168 mg/dL — ABNORMAL HIGH (ref 0–99)
Total CHOL/HDL Ratio: 4.4 Ratio
Triglycerides: 100 mg/dL (ref <150)
VLDL: 20 mg/dL (ref 0–40)

## 2015-05-10 LAB — TSH: TSH: 2.443 u[IU]/mL (ref 0.350–4.500)

## 2015-05-10 MED ORDER — LEVOTHYROXINE SODIUM 112 MCG PO TABS: ORAL_TABLET | ORAL | Status: AC

## 2015-05-10 MED ORDER — FLUOXETINE HCL 40 MG PO CAPS: 40.0000 mg | ORAL_CAPSULE | Freq: Every day | ORAL | Status: DC

## 2015-05-10 NOTE — Patient Instructions (Signed)
Health Recommendations for Postmenopausal Women Respected and ongoing research has looked at the most common causes of death, disability, and poor quality of life in postmenopausal women. The causes include heart disease, diseases of blood vessels, diabetes, depression, cancer, and bone loss (osteoporosis). Many things can be done to help lower the chances of developing these and other common problems. CARDIOVASCULAR DISEASE Heart Disease: A heart attack is a medical emergency. Know the signs and symptoms of a heart attack. Below are things women can do to reduce their risk for heart disease.   Do not smoke. If you smoke, quit.  Aim for a healthy weight. Being overweight causes many preventable deaths. Eat a healthy and balanced diet and drink an adequate amount of liquids.  Get moving. Make a commitment to be more physically active. Aim for 30 minutes of activity on most, if not all days of the week.  Eat for heart health. Choose a diet that is low in saturated fat and cholesterol and eliminate trans fat. Include whole grains, vegetables, and fruits. Read and understand the labels on food containers before buying.  Know your numbers. Ask your caregiver to check your blood pressure, cholesterol (total, HDL, LDL, triglycerides) and blood glucose. Work with your caregiver on improving your entire clinical picture.  High blood pressure. Limit or stop your table salt intake (try salt substitute and food seasonings). Avoid salty foods and drinks. Read labels on food containers before buying. Eating well and exercising can help control high blood pressure. STROKE  Stroke is a medical emergency. Stroke may be the result of a blood clot in a blood vessel in the brain or by a brain hemorrhage (bleeding). Know the signs and symptoms of a stroke. To lower the risk of developing a stroke:  Avoid fatty foods.  Quit smoking.  Control your diabetes, blood pressure, and irregular heart rate. THROMBOPHLEBITIS  (BLOOD CLOT) OF THE LEG  Becoming overweight and leading a stationary lifestyle may also contribute to developing blood clots. Controlling your diet and exercising will help lower the risk of developing blood clots. CANCER SCREENING  Breast Cancer: Take steps to reduce your risk of breast cancer.  You should practice "breast self-awareness." This means understanding the normal appearance and feel of your breasts and should include breast self-examination. Any changes detected, no matter how small, should be reported to your caregiver.  After age 4, you should have a clinical breast exam (CBE) every year.  Starting at age 48, you should consider having a mammogram (breast X-ray) every year.  If you have a family history of breast cancer, talk to your caregiver about genetic screening.  If you are at high risk for breast cancer, talk to your caregiver about having an MRI and a mammogram every year.  Intestinal or Stomach Cancer: Tests to consider are a rectal exam, fecal occult blood, sigmoidoscopy, and colonoscopy. Women who are high risk may need to be screened at an earlier age and more often.  Cervical Cancer:  Beginning at age 72, you should have a Pap test every 3 years as long as the past 3 Pap tests have been normal.  If you have had past treatment for cervical cancer or a condition that could lead to cancer, you need Pap tests and screening for cancer for at least 20 years after your treatment.  If you had a hysterectomy for a problem that was not cancer or a condition that could lead to cancer, then you no longer need Pap tests.  If you are between ages 65 and 70, and you have had normal Pap tests going back 10 years, you no longer need Pap tests.  If Pap tests have been discontinued, risk factors (such as a new sexual partner) need to be reassessed to determine if screening should be resumed.  Some medical problems can increase the chance of getting cervical cancer. In these  cases, your caregiver may recommend more frequent screening and Pap tests.  Uterine Cancer: If you have vaginal bleeding after reaching menopause, you should notify your caregiver.  Ovarian Cancer: Other than yearly pelvic exams, there are no reliable tests available to screen for ovarian cancer at this time except for yearly pelvic exams.  Lung Cancer: Yearly chest X-rays can detect lung cancer and should be done on high risk women, such as cigarette smokers and women with chronic lung disease (emphysema).  Skin Cancer: A complete body skin exam should be done at your yearly examination. Avoid overexposure to the sun and ultraviolet light lamps. Use a strong sun block cream when in the sun. All of these things are important for lowering the risk of skin cancer. MENOPAUSE Menopause Symptoms: Hormone therapy products are effective for treating symptoms associated with menopause:  Moderate to severe hot flashes.  Night sweats.  Mood swings.  Headaches.  Tiredness.  Loss of sex drive.  Insomnia.  Other symptoms. Hormone replacement carries certain risks, especially in older women. Women who use or are thinking about using estrogen or estrogen with progestin treatments should discuss that with their caregiver. Your caregiver will help you understand the benefits and risks. The ideal dose of hormone replacement therapy is not known. The Food and Drug Administration (FDA) has concluded that hormone therapy should be used only at the lowest doses and for the shortest amount of time to reach treatment goals.  OSTEOPOROSIS Protecting Against Bone Loss and Preventing Fracture If you use hormone therapy for prevention of bone loss (osteoporosis), the risks for bone loss must outweigh the risk of the therapy. Ask your caregiver about other medications known to be safe and effective for preventing bone loss and fractures. To guard against bone loss or fractures, the following is recommended:  If  you are younger than age 50, take 1000 mg of calcium and at least 600 mg of Vitamin D per day.  If you are older than age 50 but younger than age 70, take 1200 mg of calcium and at least 600 mg of Vitamin D per day.  If you are older than age 70, take 1200 mg of calcium and at least 800 mg of Vitamin D per day. Smoking and excessive alcohol intake increases the risk of osteoporosis. Eat foods rich in calcium and vitamin D and do weight bearing exercises several times a week as your caregiver suggests. DIABETES Diabetes Mellitus: If you have type I or type 2 diabetes, you should keep your blood sugar under control with diet, exercise, and recommended medication. Avoid starchy and fatty foods, and too many sweets. Being overweight can make diabetes control more difficult. COGNITION AND MEMORY Cognition and Memory: Menopausal hormone therapy is not recommended for the prevention of cognitive disorders such as Alzheimer's disease or memory loss.  DEPRESSION  Depression may occur at any age, but it is common in elderly women. This may be because of physical, medical, social (loneliness), or financial problems and needs. If you are experiencing depression because of medical problems and control of symptoms, talk to your caregiver about this. Physical   activity and exercise may help with mood and sleep. Community and volunteer involvement may improve your sense of value and worth. If you have depression and you feel that the problem is getting worse or becoming severe, talk to your caregiver about which treatment options are best for you. ACCIDENTS  Accidents are common and can be serious in elderly woman. Prepare your house to prevent accidents. Eliminate throw rugs, place hand bars in bath, shower, and toilet areas. Avoid wearing high heeled shoes or walking on wet, snowy, and icy areas. Limit or stop driving if you have vision or hearing problems, or if you feel you are unsteady with your movements and  reflexes. HEPATITIS C Hepatitis C is a type of viral infection affecting the liver. It is spread mainly through contact with blood from an infected person. It can be treated, but if left untreated, it can lead to severe liver damage over the years. Many people who are infected do not know that the virus is in their blood. If you are a "baby-boomer", it is recommended that you have one screening test for Hepatitis C. IMMUNIZATIONS  Several immunizations are important to consider having during your senior years, including:   Tetanus, diphtheria, and pertussis booster shot.  Influenza every year before the flu season begins.  Pneumonia vaccine.  Shingles vaccine.  Others, as indicated based on your specific needs. Talk to your caregiver about these. Document Released: 01/25/2006 Document Revised: 04/19/2014 Document Reviewed: 09/20/2008 Exit[employer] Patient Information 2015 [employer]. This information is not intended to replace advice given to you by your health care provider. Make sure you discuss any questions you have with your health care provider. Exercise to Stay Healthy Exercise helps you become and stay healthy. EXERCISE IDEAS AND TIPS Choose exercises that:  You enjoy.  Fit into your day. You do not need to exercise really hard to be healthy. You can do exercises at a slow or medium level and stay healthy. You can:  Stretch before and after working out.  Try yoga, Pilates, or tai chi.  Lift weights.  Walk fast, swim, jog, run, climb stairs, bicycle, dance, or rollerskate.  Take aerobic classes. Exercises that burn about 150 calories:  Running 1  miles in 15 minutes.  Playing volleyball for 45 to 60 minutes.  Washing and waxing a car for 45 to 60 minutes.  Playing touch football for 45 minutes.  Walking 1  miles in 35 minutes.  Pushing a stroller 1  miles in 30 minutes.  Playing basketball for 30 minutes.  Raking leaves for 30 minutes.  Bicycling 5  miles in 30 minutes.  Walking 2 miles in 30 minutes.  Dancing for 30 minutes.  Shoveling snow for 15 minutes.  Swimming laps for 20 minutes.  Walking up stairs for 15 minutes.  Bicycling 4 miles in 15 minutes.  Gardening for 30 to 45 minutes.  Jumping rope for 15 minutes.  Washing windows or floors for 45 to 60 minutes. Document Released: 01/05/2011 Document Revised: 02/25/2012 Document Reviewed: 01/05/2011 Exit[employer] Patient Information 2015 [employer]. This information is not intended to replace advice given to you by your health care provider. Make sure you discuss any questions you have with your health care provider.  

## 2015-05-10 NOTE — Progress Notes (Signed)
Donna BongoSusan L Wheeler Jan 25, 1961 [id number]    History:    Presents for annual exam.  Postmenopausal with no bleeding no HRT. Normal Pap and mammogram history, last mammogram 2013. Hypothyroid on 112 g of Synthroid daily. Currently on Prozac 20 but states feels she needs to increase dosage. Long-term depression history. Had tried Lexapro in the past but did not like. Has not had a screening colonoscopy or DEXA.  Past medical history, past surgical history, family history and social history were all reviewed and documented in the EPIC chart. Works at [employer] in housekeeping. Donna JunesBrandon 22 doing well. Parents hypertension.  ROS:  A ROS was performed and pertinent positives and negatives are included.  Exam:  Filed Vitals:   05/10/15 0854  BP: 120/78    General appearance:  Normal Thyroid:  Symmetrical, normal in size, without palpable masses or nodularity. Respiratory  Auscultation:  Clear without wheezing or rhonchi Cardiovascular  Auscultation:  Regular rate, without rubs, murmurs or gallops  Edema/varicosities:  Not grossly evident Abdominal  Soft,nontender, without masses, guarding or rebound.  Liver/spleen:  No organomegaly noted  Hernia:  None appreciated  Skin  Inspection:  Grossly normal   Breasts: Examined lying and sitting.     Right: Without masses, retractions, discharge or axillary adenopathy.     Left: Without masses, retractions, discharge or axillary adenopathy. Gentitourinary   Inguinal/mons:  Normal without inguinal adenopathy  External genitalia:  Normal  BUS/Urethra/Skene's glands:  Normal  Vagina:  Atrophic  Cervix:  Normal  Uterus:  normal in size, shape and contour.  Midline and mobile  Adnexa/parametria:     Rt: Without masses or tenderness.   Lt: Without masses or tenderness.  Anus and perineum: Normal  Digital rectal exam: Normal sphincter tone without palpated masses or tenderness  Assessment/Plan:  54 y.o. MWF G1 P1 for annual exam with complaint of  depression.  Postmenopausal/no bleeding/no HRT Vaginal atrophy Depression on Prozac Hypothyroid on Synthroid  Plan: Denies need for counseling, reviewed importance of increasing regular exercise, self-care, leisure activities. Will increase Prozac to 40 mg daily, instructed to call if no relief of symptoms for referral to psychiatrist. SBE's, reviewed importance of annual screening mammogram, instructed to schedule ASAP. Reviewed importance of increasing regular exercise 30 minutes most days of the week, vitamin D 1000 daily, fish oil supplement daily history of elevated triglycerides. Synthroid 112 g by mouth daily, proper use, prescription given. [facility] information given instructed to schedule screening colonoscopy. DEXA will schedule here. T dap given today. CBC, lipid panel, CMP, TSH, vitamin D. Pap normal 2015, new screening guidelines reviewed. Continue vaginal lubricants with intercourse.   Harrington ChallengerYOUNG,Donna Wheeler J [facility] CharlestonWHNP, 9:48 AM 05/10/2015

## 2015-05-10 NOTE — Addendum Note (Signed)
Addended by: Dayna BarkerGARDNER, Jaedyn Marrufo K on: 05/10/2015 10:03 AM   Modules accepted: Orders

## 2015-05-11 LAB — URINALYSIS W MICROSCOPIC + REFLEX CULTURE
BACTERIA UA: NONE SEEN
BILIRUBIN URINE: NEGATIVE
Casts: NONE SEEN
Crystals: NONE SEEN
Glucose, UA: NEGATIVE mg/dL
HGB URINE DIPSTICK: NEGATIVE
KETONES UR: NEGATIVE mg/dL
Leukocytes, UA: NEGATIVE
Nitrite: NEGATIVE
PH: 5.5 (ref 5.0–8.0)
Protein, ur: NEGATIVE mg/dL
Specific Gravity, Urine: 1.029 (ref 1.005–1.030)
Squamous Epithelial / LPF: NONE SEEN
Urobilinogen, UA: 1 mg/dL (ref 0.0–1.0)

## 2015-05-11 LAB — VITAMIN D 25 HYDROXY (VIT D DEFICIENCY, FRACTURES): Vit D, 25-Hydroxy: 30 ng/mL (ref 30–100)

## 2015-06-15 ENCOUNTER — Other Ambulatory Visit: Payer: Self-pay

## 2015-06-15 DIAGNOSIS — F329 Major depressive disorder, single episode, unspecified: Secondary | ICD-10-CM

## 2015-06-15 DIAGNOSIS — F32A Depression, unspecified: Secondary | ICD-10-CM

## 2015-06-15 MED ORDER — FLUOXETINE HCL 40 MG PO CAPS: 40.0000 mg | ORAL_CAPSULE | Freq: Every day | ORAL | Status: DC

## 2015-09-22 ENCOUNTER — Other Ambulatory Visit: Payer: Self-pay

## 2015-09-22 DIAGNOSIS — Z1231 Encounter for screening mammogram for malignant neoplasm of breast: Secondary | ICD-10-CM

## 2015-09-23 ENCOUNTER — Ambulatory Visit
Admission: RE | Admit: 2015-09-23 | Discharge: 2015-09-23 | Disposition: A | Discharge status: Home / Self Care | Payer: PRIVATE HEALTH INSURANCE | Source: Ambulatory Visit

## 2015-09-23 DIAGNOSIS — Z1231 Encounter for screening mammogram for malignant neoplasm of breast: Secondary | ICD-10-CM

## 2017-05-01 ENCOUNTER — Encounter: Payer: Self-pay | Admitting: Gynecology

## 2018-09-01 ENCOUNTER — Other Ambulatory Visit: Payer: Self-pay | Admitting: Family Medicine

## 2018-09-01 DIAGNOSIS — Z1231 Encounter for screening mammogram for malignant neoplasm of breast: Secondary | ICD-10-CM

## 2018-10-03 ENCOUNTER — Ambulatory Visit
Admission: RE | Admit: 2018-10-03 | Discharge: 2018-10-03 | Disposition: A | Discharge status: Home / Self Care | Payer: No Typology Code available for payment source | Source: Ambulatory Visit | Attending: Family Medicine | Admitting: Family Medicine

## 2018-10-03 DIAGNOSIS — Z1231 Encounter for screening mammogram for malignant neoplasm of breast: Secondary | ICD-10-CM

## 2018-11-04 ENCOUNTER — Encounter: Payer: Self-pay | Admitting: Women's Health

## 2018-11-04 ENCOUNTER — Ambulatory Visit: Payer: PRIVATE HEALTH INSURANCE | Admitting: Women's Health

## 2018-11-04 VITALS — BP 110/80 | Ht 65.0 in | Wt 163.0 lb

## 2018-11-04 DIAGNOSIS — Z13820 Encounter for screening for osteoporosis: Secondary | ICD-10-CM | POA: Diagnosis not present

## 2018-11-04 DIAGNOSIS — Z01419 Encounter for gynecological examination (general) (routine) without abnormal findings: Secondary | ICD-10-CM

## 2018-11-04 DIAGNOSIS — Z13220 Encounter for screening for lipoid disorders: Secondary | ICD-10-CM

## 2018-11-04 NOTE — Progress Notes (Signed)
..  lb

## 2018-11-04 NOTE — Patient Instructions (Addendum)
Colonoscopy  Dr Celine Ahr  [phone]  Vit d3 2000 iu daily and red rice yeast supp       Health Maintenance for Postmenopausal Women Menopause is a normal process in which your reproductive ability comes to an end. This process happens gradually over a span of months to years, usually between the ages of 65 and 66. Menopause is complete when you have missed 12 consecutive menstrual periods. It is important to talk with your health care provider about some of the most common conditions that affect postmenopausal women, such as heart disease, cancer, and bone loss (osteoporosis). Adopting a healthy lifestyle and getting preventive care can help to promote your health and wellness. Those actions can also lower your chances of developing some of these common conditions. What should I know about menopause? During menopause, you may experience a number of symptoms, such as:  Moderate-to-severe hot flashes.  Night sweats.  Decrease in sex drive.  Mood swings.  Headaches.  Tiredness.  Irritability.  Memory problems.  Insomnia.  Choosing to treat or not to treat menopausal changes is an individual decision that you make with your health care provider. What should I know about hormone replacement therapy and supplements? Hormone therapy products are effective for treating symptoms that are associated with menopause, such as hot flashes and night sweats. Hormone replacement carries certain risks, especially as you become older. If you are thinking about using estrogen or estrogen with progestin treatments, discuss the benefits and risks with your health care provider. What should I know about heart disease and stroke? Heart disease, heart attack, and stroke become more likely as you age. This may be due, in part, to the hormonal changes that your body experiences during menopause. These can affect how your body processes dietary fats, triglycerides, and cholesterol. Heart attack and  stroke are both medical emergencies. There are many things that you can do to help prevent heart disease and stroke:  Have your blood pressure checked at least every 1-2 years. High blood pressure causes heart disease and increases the risk of stroke.  If you are 84-53 years old, ask your health care provider if you should take aspirin to prevent a heart attack or a stroke.  Do not use any tobacco products, including cigarettes, chewing tobacco, or electronic cigarettes. If you need help quitting, ask your health care provider.  It is important to eat a healthy diet and maintain a healthy weight. ? Be sure to include plenty of vegetables, fruits, low-fat dairy products, and lean protein. ? Avoid eating foods that are high in solid fats, added sugars, or salt (sodium).  Get regular exercise. This is one of the most important things that you can do for your health. ? Try to exercise for at least 150 minutes each week. The type of exercise that you do should increase your heart rate and make you sweat. This is known as moderate-intensity exercise. ? Try to do strengthening exercises at least twice each week. Do these in addition to the moderate-intensity exercise.  Know your numbers.Ask your health care provider to check your cholesterol and your blood glucose. Continue to have your blood tested as directed by your health care provider.  What should I know about cancer screening? There are several types of cancer. Take the following steps to reduce your risk and to catch any cancer development as early as possible. Breast Cancer  Practice breast self-awareness. ? This means understanding how your breasts normally appear and feel. ?  It also means doing regular breast self-exams. Let your health care provider know about any changes, no matter how small.  If you are 86 or older, have a clinician do a breast exam (clinical breast exam or CBE) every year. Depending on your age, family history,  and medical history, it may be recommended that you also have a yearly breast X-ray (mammogram).  If you have a family history of breast cancer, talk with your health care provider about genetic screening.  If you are at high risk for breast cancer, talk with your health care provider about having an MRI and a mammogram every year.  Breast cancer (BRCA) gene test is recommended for women who have family members with BRCA-related cancers. Results of the assessment will determine the need for genetic counseling and BRCA1 and for BRCA2 testing. BRCA-related cancers include these types: ? Breast. This occurs in males or females. ? Ovarian. ? Tubal. This may also be called fallopian tube cancer. ? Cancer of the abdominal or pelvic lining (peritoneal cancer). ? Prostate. ? Pancreatic.  Cervical, Uterine, and Ovarian Cancer Your health care provider may recommend that you be screened regularly for cancer of the pelvic organs. These include your ovaries, uterus, and vagina. This screening involves a pelvic exam, which includes checking for microscopic changes to the surface of your cervix (Pap test).  For women ages 21-65, health care providers may recommend a pelvic exam and a Pap test every three years. For women ages 66-65, they may recommend the Pap test and pelvic exam, combined with testing for human papilloma virus (HPV), every five years. Some types of HPV increase your risk of cervical cancer. Testing for HPV may also be done on women of any age who have unclear Pap test results.  Other health care providers may not recommend any screening for nonpregnant women who are considered low risk for pelvic cancer and have no symptoms. Ask your health care provider if a screening pelvic exam is right for you.  If you have had past treatment for cervical cancer or a condition that could lead to cancer, you need Pap tests and screening for cancer for at least 20 years after your treatment. If Pap tests  have been discontinued for you, your risk factors (such as having a new sexual partner) need to be reassessed to determine if you should start having screenings again. Some women have medical problems that increase the chance of getting cervical cancer. In these cases, your health care provider may recommend that you have screening and Pap tests more often.  If you have a family history of uterine cancer or ovarian cancer, talk with your health care provider about genetic screening.  If you have vaginal bleeding after reaching menopause, tell your health care provider.  There are currently no reliable tests available to screen for ovarian cancer.  Lung Cancer Lung cancer screening is recommended for adults 53-62 years old who are at high risk for lung cancer because of a history of smoking. A yearly low-dose CT scan of the lungs is recommended if you:  Currently smoke.  Have a history of at least 30 pack-years of smoking and you currently smoke or have quit within the past 15 years. A pack-year is smoking an average of one pack of cigarettes per day for one year.  Yearly screening should:  Continue until it has been 15 years since you quit.  Stop if you develop a health problem that would prevent you from having lung cancer  treatment.  Colorectal Cancer  This type of cancer can be detected and can often be prevented.  Routine colorectal cancer screening usually begins at age 80 and continues through age 58.  If you have risk factors for colon cancer, your health care provider may recommend that you be screened at an earlier age.  If you have a family history of colorectal cancer, talk with your health care provider about genetic screening.  Your health care provider may also recommend using home test kits to check for hidden blood in your stool.  A small camera at the end of a tube can be used to examine your colon directly (sigmoidoscopy or colonoscopy). This is done to check for  the earliest forms of colorectal cancer.  Direct examination of the colon should be repeated every 5-10 years until age 15. However, if early forms of precancerous polyps or small growths are found or if you have a family history or genetic risk for colorectal cancer, you may need to be screened more often.  Skin Cancer  Check your skin from head to toe regularly.  Monitor any moles. Be sure to tell your health care provider: ? About any new moles or changes in moles, especially if there is a change in a mole's shape or color. ? If you have a mole that is larger than the size of a pencil eraser.  If any of your family members has a history of skin cancer, especially at a Caspian Deleonardis age, talk with your health care provider about genetic screening.  Always use sunscreen. Apply sunscreen liberally and repeatedly throughout the day.  Whenever you are outside, protect yourself by wearing long sleeves, pants, a wide-brimmed hat, and sunglasses.  What should I know about osteoporosis? Osteoporosis is a condition in which bone destruction happens more quickly than new bone creation. After menopause, you may be at an increased risk for osteoporosis. To help prevent osteoporosis or the bone fractures that can happen because of osteoporosis, the following is recommended:  If you are 72-64 years old, get at least 1,000 mg of calcium and at least 600 mg of vitamin D per day.  If you are older than age 59 but younger than age 34, get at least 1,200 mg of calcium and at least 600 mg of vitamin D per day.  If you are older than age 66, get at least 1,200 mg of calcium and at least 800 mg of vitamin D per day.  Smoking and excessive alcohol intake increase the risk of osteoporosis. Eat foods that are rich in calcium and vitamin D, and do weight-bearing exercises several times each week as directed by your health care provider. What should I know about how menopause affects my mental health? Depression may  occur at any age, but it is more common as you become older. Common symptoms of depression include:  Low or sad mood.  Changes in sleep patterns.  Changes in appetite or eating patterns.  Feeling an overall lack of motivation or enjoyment of activities that you previously enjoyed.  Frequent crying spells.  Talk with your health care provider if you think that you are experiencing depression. What should I know about immunizations? It is important that you get and maintain your immunizations. These include:  Tetanus, diphtheria, and pertussis (Tdap) booster vaccine.  Influenza every year before the flu season begins.  Pneumonia vaccine.  Shingles vaccine.  Your health care provider may also recommend other immunizations. This information is not intended to replace  advice given to you by your health care provider. Make sure you discuss any questions you have with your health care provider. Document Released: 01/25/2006 Document Revised: 06/22/2016 Document Reviewed: 09/06/2015 Elsevier Interactive Patient Education  2018 [employer].

## 2018-11-04 NOTE — Progress Notes (Signed)
Donna BongoSusan L Wheeler July 12, 1961 [id number]    History:    Presents for annual exam.  Postmenopausal on no HRT with no bleeding.  Normal Pap and mammogram history.  Has had some elevated cholesterol in the past. Last here 2016 but has seen primary care for anxiety/depression and hypothyroidism.  Had a negative Cologuard , has not had a colonoscopy..  Past medical history, past surgical history, family history and social history were all reviewed and documented in the EPIC chart.  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  ROS:  A ROS was performed and pertinent positives and negatives are included.  Exam:  Vitals:   11/04/18 0952  BP: 110/80  Weight: 163 lb (73.9 kg)  Height: 5\' 5"  (1.651 m)   Body mass index is 27.12 kg/m.   General appearance:  Normal Thyroid:  Symmetrical, normal in size, without palpable masses or nodularity. Respiratory  Auscultation:  Clear without wheezing or rhonchi Cardiovascular  Auscultation:  Regular rate, without rubs, murmurs or gallops  Edema/varicosities:  Not grossly evident Abdominal  Soft,nontender, without masses, guarding or rebound.  Liver/spleen:  No organomegaly noted  Hernia:  None appreciated  Skin  Inspection:  Grossly normal   Breasts: Examined lying and sitting.     Right: Without masses, retractions, discharge or axillary adenopathy.     Left: Without masses, retractions, discharge or axillary adenopathy. Gentitourinary   Inguinal/mons:  Normal without inguinal adenopathy  External genitalia:  Normal  BUS/Urethra/Skene's glands:  Normal  Vagina: Atrophic  Cervix:  Normal  Uterus:  normal in size, shape and contour.  Midline and mobile  Adnexa/parametria:     Rt: Without masses or tenderness.   Lt: Without masses or tenderness.  Anus and perineum: Normal  Digital rectal exam: Normal sphincter tone without palpated masses or tenderness  Assessment/Plan:  57 y.o. M WF G1, P1 for annual exam with no  complaints.  Postmenopausal/no HRT/no bleeding Hypothyroid, anxiety/depression-primary care manages  Plan: Colonoscopy reviewed and encouraged [facility] information given.  SBE's, annual screening mammogram, calcium rich foods, vitamin D 2000 daily encouraged.  Reviewed importance of increasing weightbearing and balance type exercise.  Schedule DEXA.  Continue vaginal lubricants with intercourse.  Pap with HR HPV typing, new screening guidelines reviewed.  CBC, CMP, lipid panel,    Harrington Challengerancy J  [facility]NP, 10:36 AM 11/04/2018

## 2018-11-05 LAB — CBC WITH DIFFERENTIAL/PLATELET
BASOS ABS: 69 {cells}/uL (ref 0–200)
Basophils Relative: 1 %
Eosinophils Absolute: 304 cells/uL (ref 15–500)
Eosinophils Relative: 4.4 %
HCT: 41.1 % (ref 35.0–45.0)
Hemoglobin: 14.2 g/dL (ref 11.7–15.5)
LYMPHS ABS: 1746 {cells}/uL (ref 850–3900)
MCH: 32.6 pg (ref 27.0–33.0)
MCHC: 34.5 g/dL (ref 32.0–36.0)
MCV: 94.5 fL (ref 80.0–100.0)
MPV: 10.4 fL (ref 7.5–12.5)
Monocytes Relative: 7.8 %
NEUTROS ABS: 4244 {cells}/uL (ref 1500–7800)
NEUTROS PCT: 61.5 %
PLATELETS: 241 10*3/uL (ref 140–400)
RBC: 4.35 10*6/uL (ref 3.80–5.10)
RDW: 11.8 % (ref 11.0–15.0)
Total Lymphocyte: 25.3 %
WBC mixed population: 538 cells/uL (ref 200–950)
WBC: 6.9 10*3/uL (ref 3.8–10.8)

## 2018-11-05 LAB — COMPREHENSIVE METABOLIC PANEL
AG Ratio: 1.6 (calc) (ref 1.0–2.5)
ALBUMIN MSPROF: 4.2 g/dL (ref 3.6–5.1)
ALT: 22 U/L (ref 6–29)
AST: 25 U/L (ref 10–35)
Alkaline phosphatase (APISO): 65 U/L (ref 33–130)
BUN: 16 mg/dL (ref 7–25)
CALCIUM: 9.2 mg/dL (ref 8.6–10.4)
CHLORIDE: 103 mmol/L (ref 98–110)
CO2: 26 mmol/L (ref 20–32)
CREATININE: 0.87 mg/dL (ref 0.50–1.05)
GLOBULIN: 2.6 g/dL (ref 1.9–3.7)
Glucose, Bld: 79 mg/dL (ref 65–99)
Potassium: 4.5 mmol/L (ref 3.5–5.3)
SODIUM: 138 mmol/L (ref 135–146)
TOTAL PROTEIN: 6.8 g/dL (ref 6.1–8.1)
Total Bilirubin: 0.7 mg/dL (ref 0.2–1.2)

## 2018-11-05 LAB — LIPID PANEL
CHOL/HDL RATIO: 4.2 (calc) (ref <5.0)
CHOLESTEROL: 239 mg/dL — AB (ref <200)
HDL: 57 mg/dL (ref >50)
LDL CHOLESTEROL (CALC): 156 mg/dL — AB
Non-HDL Cholesterol (Calc): 182 mg/dL (calc) — ABNORMAL HIGH (ref <130)
Triglycerides: 140 mg/dL (ref <150)

## 2018-11-07 LAB — PAP, TP IMAGING W/ HPV RNA, RFLX HPV TYPE 16,18/45: HPV DNA HIGH RISK: NOT DETECTED

## 2019-10-02 ENCOUNTER — Emergency Department (HOSPITAL_COMMUNITY): Payer: Managed Care, Other (non HMO)

## 2019-10-02 ENCOUNTER — Other Ambulatory Visit: Payer: Self-pay

## 2019-10-02 ENCOUNTER — Encounter (HOSPITAL_COMMUNITY): Payer: Self-pay | Admitting: Emergency Medicine

## 2019-10-02 ENCOUNTER — Inpatient Hospital Stay (HOSPITAL_COMMUNITY)
Admission: EM | Admit: 2019-10-02 | Discharge: 2019-10-07 | DRG: 177 | Disposition: A | Discharge status: Home / Self Care | Payer: Managed Care, Other (non HMO) | Attending: Internal Medicine | Admitting: Internal Medicine

## 2019-10-02 DIAGNOSIS — Z79899 Other long term (current) drug therapy: Secondary | ICD-10-CM | POA: Diagnosis not present

## 2019-10-02 DIAGNOSIS — E860 Dehydration: Secondary | ICD-10-CM | POA: Diagnosis present

## 2019-10-02 DIAGNOSIS — R197 Diarrhea, unspecified: Secondary | ICD-10-CM | POA: Diagnosis not present

## 2019-10-02 DIAGNOSIS — Z79890 Hormone replacement therapy (postmenopausal): Secondary | ICD-10-CM | POA: Diagnosis not present

## 2019-10-02 DIAGNOSIS — E876 Hypokalemia: Secondary | ICD-10-CM | POA: Diagnosis present

## 2019-10-02 DIAGNOSIS — J1289 Other viral pneumonia: Secondary | ICD-10-CM | POA: Diagnosis present

## 2019-10-02 DIAGNOSIS — U071 COVID-19: Principal | ICD-10-CM | POA: Diagnosis present

## 2019-10-02 DIAGNOSIS — F419 Anxiety disorder, unspecified: Secondary | ICD-10-CM | POA: Diagnosis present

## 2019-10-02 DIAGNOSIS — Z8249 Family history of ischemic heart disease and other diseases of the circulatory system: Secondary | ICD-10-CM | POA: Diagnosis not present

## 2019-10-02 DIAGNOSIS — A0839 Other viral enteritis: Secondary | ICD-10-CM | POA: Diagnosis present

## 2019-10-02 DIAGNOSIS — F329 Major depressive disorder, single episode, unspecified: Secondary | ICD-10-CM | POA: Diagnosis present

## 2019-10-02 DIAGNOSIS — Z833 Family history of diabetes mellitus: Secondary | ICD-10-CM

## 2019-10-02 DIAGNOSIS — R7989 Other specified abnormal findings of blood chemistry: Secondary | ICD-10-CM | POA: Diagnosis present

## 2019-10-02 DIAGNOSIS — Z23 Encounter for immunization: Secondary | ICD-10-CM | POA: Diagnosis not present

## 2019-10-02 DIAGNOSIS — Z803 Family history of malignant neoplasm of breast: Secondary | ICD-10-CM

## 2019-10-02 DIAGNOSIS — E039 Hypothyroidism, unspecified: Secondary | ICD-10-CM | POA: Diagnosis present

## 2019-10-02 DIAGNOSIS — Z880 Allergy status to penicillin: Secondary | ICD-10-CM | POA: Diagnosis not present

## 2019-10-02 LAB — CBC WITH DIFFERENTIAL/PLATELET
Abs Immature Granulocytes: 0.02 10*3/uL (ref 0.00–0.07)
Basophils Absolute: 0 10*3/uL (ref 0.0–0.1)
Basophils Relative: 0 %
Eosinophils Absolute: 0 10*3/uL (ref 0.0–0.5)
Eosinophils Relative: 0 %
HCT: 36.7 % (ref 36.0–46.0)
Hemoglobin: 12.6 g/dL (ref 12.0–15.0)
Immature Granulocytes: 0 %
Lymphocytes Relative: 23 %
Lymphs Abs: 1.1 10*3/uL (ref 0.7–4.0)
MCH: 32.8 pg (ref 26.0–34.0)
MCHC: 34.3 g/dL (ref 30.0–36.0)
MCV: 95.6 fL (ref 80.0–100.0)
Monocytes Absolute: 0.2 10*3/uL (ref 0.1–1.0)
Monocytes Relative: 5 %
Neutro Abs: 3.4 10*3/uL (ref 1.7–7.7)
Neutrophils Relative %: 72 %
Platelets: 181 10*3/uL (ref 150–400)
RBC: 3.84 MIL/uL — ABNORMAL LOW (ref 3.87–5.11)
RDW: 12.3 % (ref 11.5–15.5)
WBC: 4.7 10*3/uL (ref 4.0–10.5)
nRBC: 0 % (ref 0.0–0.2)

## 2019-10-02 LAB — I-STAT BETA HCG BLOOD, ED (MC, WL, AP ONLY): I-stat hCG, quantitative: <5 m[IU]/mL (ref <5)

## 2019-10-02 LAB — COMPREHENSIVE METABOLIC PANEL
ALT: 37 U/L (ref 0–44)
AST: 47 U/L — ABNORMAL HIGH (ref 15–41)
Albumin: 3.1 g/dL — ABNORMAL LOW (ref 3.5–5.0)
Alkaline Phosphatase: 206 U/L — ABNORMAL HIGH (ref 38–126)
Anion gap: 12 (ref 5–15)
BUN: 15 mg/dL (ref 6–20)
CO2: 19 mmol/L — ABNORMAL LOW (ref 22–32)
Calcium: 7.7 mg/dL — ABNORMAL LOW (ref 8.9–10.3)
Chloride: 103 mmol/L (ref 98–111)
Creatinine, Ser: 0.79 mg/dL (ref 0.44–1.00)
GFR calc Af Amer: >60 mL/min (ref >60)
GFR calc non Af Amer: >60 mL/min (ref >60)
Glucose, Bld: 85 mg/dL (ref 70–99)
Potassium: 2.9 mmol/L — ABNORMAL LOW (ref 3.5–5.1)
Sodium: 134 mmol/L — ABNORMAL LOW (ref 135–145)
Total Bilirubin: 1 mg/dL (ref 0.3–1.2)
Total Protein: 7.2 g/dL (ref 6.5–8.1)

## 2019-10-02 LAB — D-DIMER, QUANTITATIVE: D-Dimer, Quant: 0.94 ug/mL-FEU — ABNORMAL HIGH (ref 0.00–0.50)

## 2019-10-02 LAB — FIBRINOGEN: Fibrinogen: >800 mg/dL — ABNORMAL HIGH (ref 210–475)

## 2019-10-02 LAB — LACTATE DEHYDROGENASE: LDH: 265 U/L — ABNORMAL HIGH (ref 98–192)

## 2019-10-02 LAB — C-REACTIVE PROTEIN: CRP: 22.6 mg/dL — ABNORMAL HIGH (ref <1.0)

## 2019-10-02 LAB — LIPASE, BLOOD: Lipase: 43 U/L (ref 11–51)

## 2019-10-02 LAB — LACTIC ACID, PLASMA
Lactic Acid, Venous: 0.8 mmol/L (ref 0.5–1.9)
Lactic Acid, Venous: 0.6 mmol/L (ref 0.5–1.9)

## 2019-10-02 LAB — FERRITIN: Ferritin: 966 ng/mL — ABNORMAL HIGH (ref 11–307)

## 2019-10-02 LAB — PROCALCITONIN: Procalcitonin: 1.58 ng/mL

## 2019-10-02 LAB — TRIGLYCERIDES: Triglycerides: 178 mg/dL — ABNORMAL HIGH (ref <150)

## 2019-10-02 MED ORDER — POTASSIUM CHLORIDE CRYS ER 20 MEQ PO TBCR
40.0000 meq | EXTENDED_RELEASE_TABLET | Freq: Once | ORAL | Status: AC
  Administered 2019-10-02: 40 meq via ORAL
  Filled 2019-10-02: qty 2

## 2019-10-02 MED ORDER — SODIUM CHLORIDE 0.9 % IV SOLN
INTRAVENOUS | Status: DC
  Administered 2019-10-02: 21:00:00 via INTRAVENOUS

## 2019-10-02 MED ORDER — SODIUM CHLORIDE 0.9 % IV BOLUS
1000.0000 mL | Freq: Once | INTRAVENOUS | Status: AC
  Administered 2019-10-02: 1000 mL via INTRAVENOUS

## 2019-10-02 MED ORDER — POTASSIUM CHLORIDE 10 MEQ/100ML IV SOLN
10.0000 meq | Freq: Once | INTRAVENOUS | Status: AC
  Administered 2019-10-02: 10 meq via INTRAVENOUS
  Filled 2019-10-02: qty 100

## 2019-10-02 MED ORDER — ACETAMINOPHEN 325 MG PO TABS
650.0000 mg | ORAL_TABLET | Freq: Once | ORAL | Status: AC
  Administered 2019-10-02: 650 mg via ORAL
  Filled 2019-10-02: qty 2

## 2019-10-02 NOTE — ED Triage Notes (Signed)
Patient is from home and has had nausea, vomiting and diarrhea for the last 10 days. She tested positive for Covid 19 on Friday. EMS administered 4mg  of Zofran and 500 cc of fluid.     EMS vitals: 112/62 BP 92% O2 sat on room air  90 HR

## 2019-10-02 NOTE — ED Notes (Signed)
Patient ambulated in room. Ambulation tolerated well. O2 sats did not drop below 93.

## 2019-10-03 ENCOUNTER — Encounter (HOSPITAL_COMMUNITY): Payer: Self-pay | Admitting: Internal Medicine

## 2019-10-03 DIAGNOSIS — U071 COVID-19: Principal | ICD-10-CM

## 2019-10-03 DIAGNOSIS — E876 Hypokalemia: Secondary | ICD-10-CM | POA: Diagnosis present

## 2019-10-03 DIAGNOSIS — E860 Dehydration: Secondary | ICD-10-CM | POA: Diagnosis present

## 2019-10-03 DIAGNOSIS — R197 Diarrhea, unspecified: Secondary | ICD-10-CM

## 2019-10-03 LAB — SARS CORONAVIRUS 2 (TAT 6-24 HRS): SARS Coronavirus 2: POSITIVE — AB

## 2019-10-03 LAB — CBC
HCT: 35 % — ABNORMAL LOW (ref 36.0–46.0)
Hemoglobin: 11.6 g/dL — ABNORMAL LOW (ref 12.0–15.0)
MCH: 31.9 pg (ref 26.0–34.0)
MCHC: 33.1 g/dL (ref 30.0–36.0)
MCV: 96.2 fL (ref 80.0–100.0)
Platelets: 156 10*3/uL (ref 150–400)
RBC: 3.64 MIL/uL — ABNORMAL LOW (ref 3.87–5.11)
RDW: 12.3 % (ref 11.5–15.5)
WBC: 4.7 10*3/uL (ref 4.0–10.5)
nRBC: 0 % (ref 0.0–0.2)

## 2019-10-03 LAB — CREATININE, SERUM
Creatinine, Ser: 0.78 mg/dL (ref 0.44–1.00)
GFR calc Af Amer: >60 mL/min (ref >60)
GFR calc non Af Amer: >60 mL/min (ref >60)

## 2019-10-03 LAB — TYPE AND SCREEN
ABO/RH(D): A POS
Antibody Screen: NEGATIVE

## 2019-10-03 LAB — C-REACTIVE PROTEIN: CRP: 18 mg/dL — ABNORMAL HIGH (ref <1.0)

## 2019-10-03 LAB — FERRITIN: Ferritin: 731 ng/mL — ABNORMAL HIGH (ref 11–307)

## 2019-10-03 LAB — MAGNESIUM: Magnesium: 2.4 mg/dL (ref 1.7–2.4)

## 2019-10-03 LAB — ABO/RH: ABO/RH(D): A POS

## 2019-10-03 LAB — HIV ANTIBODY (ROUTINE TESTING W REFLEX): HIV Screen 4th Generation wRfx: NONREACTIVE

## 2019-10-03 LAB — TROPONIN I (HIGH SENSITIVITY): Troponin I (High Sensitivity): 7 ng/L (ref <18)

## 2019-10-03 MED ORDER — SODIUM CHLORIDE 0.9 % IV SOLN
100.0000 mg | INTRAVENOUS | Status: DC
  Filled 2019-10-03: qty 20

## 2019-10-03 MED ORDER — GUAIFENESIN-DM 100-10 MG/5ML PO SYRP
5.0000 mL | ORAL_SOLUTION | ORAL | Status: DC | PRN
  Administered 2019-10-03: 5 mL via ORAL
  Filled 2019-10-03: qty 5

## 2019-10-03 MED ORDER — ACETAMINOPHEN 325 MG PO TABS
650.0000 mg | ORAL_TABLET | Freq: Once | ORAL | Status: AC
  Administered 2019-10-03: 650 mg via ORAL
  Filled 2019-10-03: qty 2

## 2019-10-03 MED ORDER — POTASSIUM CHLORIDE 2 MEQ/ML IV SOLN
INTRAVENOUS | Status: DC
  Administered 2019-10-03 (×2): via INTRAVENOUS
  Filled 2019-10-03 (×3): qty 1000

## 2019-10-03 MED ORDER — SODIUM CHLORIDE 0.9 % IV SOLN
100.0000 mg | INTRAVENOUS | Status: AC
  Administered 2019-10-04 – 2019-10-07 (×4): 100 mg via INTRAVENOUS
  Filled 2019-10-03 (×4): qty 20

## 2019-10-03 MED ORDER — ENOXAPARIN SODIUM 40 MG/0.4ML ~~LOC~~ SOLN
40.0000 mg | Freq: Every day | SUBCUTANEOUS | Status: DC
  Administered 2019-10-03 – 2019-10-07 (×5): 40 mg via SUBCUTANEOUS
  Filled 2019-10-03 (×6): qty 0.4

## 2019-10-03 MED ORDER — ZOLPIDEM TARTRATE 5 MG PO TABS
5.0000 mg | ORAL_TABLET | Freq: Once | ORAL | Status: AC
  Administered 2019-10-03: 5 mg via ORAL
  Filled 2019-10-03: qty 1

## 2019-10-03 MED ORDER — ONDANSETRON HCL 4 MG/2ML IJ SOLN: 4.0000 mg | Freq: Four times a day (QID) | INTRAMUSCULAR | Status: DC | PRN

## 2019-10-03 MED ORDER — ONDANSETRON HCL 4 MG PO TABS: 4.0000 mg | ORAL_TABLET | Freq: Four times a day (QID) | ORAL | Status: DC | PRN

## 2019-10-03 MED ORDER — PIPERACILLIN-TAZOBACTAM 3.375 G IVPB
3.3750 g | Freq: Three times a day (TID) | INTRAVENOUS | Status: DC
  Filled 2019-10-03 (×2): qty 50

## 2019-10-03 MED ORDER — VANCOMYCIN HCL 10 G IV SOLR
1750.0000 mg | Freq: Once | INTRAVENOUS | Status: AC
  Administered 2019-10-03: 1750 mg via INTRAVENOUS
  Filled 2019-10-03: qty 1750

## 2019-10-03 MED ORDER — SODIUM CHLORIDE 0.9 % IV SOLN
200.0000 mg | Freq: Once | INTRAVENOUS | Status: AC
  Administered 2019-10-03: 200 mg via INTRAVENOUS
  Filled 2019-10-03: qty 40

## 2019-10-03 MED ORDER — ACETAMINOPHEN 325 MG PO TABS: 650.0000 mg | ORAL_TABLET | Freq: Four times a day (QID) | ORAL | Status: DC | PRN

## 2019-10-03 MED ORDER — ESCITALOPRAM OXALATE 20 MG PO TABS
20.0000 mg | ORAL_TABLET | Freq: Every day | ORAL | Status: DC
  Administered 2019-10-03 – 2019-10-07 (×5): 20 mg via ORAL
  Filled 2019-10-03 (×6): qty 1

## 2019-10-03 MED ORDER — DEXAMETHASONE SODIUM PHOSPHATE 10 MG/ML IJ SOLN
6.0000 mg | INTRAMUSCULAR | Status: DC
  Administered 2019-10-03: 6 mg via INTRAVENOUS
  Filled 2019-10-03: qty 1

## 2019-10-03 MED ORDER — VANCOMYCIN HCL 10 G IV SOLR: 1750.0000 mg | Freq: Every day | INTRAVENOUS | Status: DC

## 2019-10-03 MED ORDER — INFLUENZA VAC SPLIT QUAD 0.5 ML IM SUSY
0.5000 mL | PREFILLED_SYRINGE | INTRAMUSCULAR | Status: DC
  Filled 2019-10-03: qty 0.5

## 2019-10-03 MED ORDER — METHYLPREDNISOLONE SODIUM SUCC 125 MG IJ SOLR
60.0000 mg | Freq: Two times a day (BID) | INTRAMUSCULAR | Status: DC
  Administered 2019-10-03 – 2019-10-07 (×9): 60 mg via INTRAVENOUS
  Filled 2019-10-03 (×9): qty 2

## 2019-10-03 MED ORDER — PIPERACILLIN-TAZOBACTAM 3.375 G IVPB 30 MIN
3.3750 g | Freq: Once | INTRAVENOUS | Status: AC
  Administered 2019-10-03: 3.375 g via INTRAVENOUS
  Filled 2019-10-03: qty 50

## 2019-10-03 MED ORDER — ALBUTEROL SULFATE HFA 108 (90 BASE) MCG/ACT IN AERS
2.0000 | INHALATION_SPRAY | Freq: Four times a day (QID) | RESPIRATORY_TRACT | Status: DC | PRN
  Filled 2019-10-03: qty 6.7

## 2019-10-03 MED ORDER — ACETAMINOPHEN 650 MG RE SUPP: 650.0000 mg | Freq: Four times a day (QID) | RECTAL | Status: DC | PRN

## 2019-10-03 MED ORDER — POTASSIUM CHLORIDE CRYS ER 20 MEQ PO TBCR
30.0000 meq | EXTENDED_RELEASE_TABLET | Freq: Once | ORAL | Status: AC
  Administered 2019-10-03: 30 meq via ORAL
  Filled 2019-10-03: qty 2

## 2019-10-03 MED ORDER — LEVOTHYROXINE SODIUM 112 MCG PO TABS
112.0000 ug | ORAL_TABLET | Freq: Every day | ORAL | Status: DC
  Administered 2019-10-03 – 2019-10-07 (×5): 112 ug via ORAL
  Filled 2019-10-03 (×6): qty 1

## 2019-10-03 NOTE — Evaluation (Signed)
Physical Therapy Evaluation Patient Details Name: Donna Wheeler MRN: [id number] DOB: 06/02/1961 Today's Date: 10/03/2019   History of Present Illness  58 y/o female w/ hx of anxiety, depression, hypothyroid,  dx with COVID approx 10/10 but was sent home. continued to have n/v/d/ and coughing until hospital admit 10/16. Spouse is also hospitalized with COVID at this hospital.  Clinical Impression   Pt admitted with above diagnosis. PTA pt was very independent and lived home alone with spouse. Pt currently with functional limitations due to the deficits in balance coordination, strength and activity tolerance. She was on room air throughout and managed to keep 02 sats >90 throughout HR increased to 110 with ambulation and RR 34. Pt needed min a and HHA to complete ambulation up to approx 164ft. Pt will benefit from skilled PT to increase their independence and safety with mobility to allow discharge to the venue listed below.       Follow Up Recommendations Home health PT    Equipment Recommendations  None recommended by PT    Recommendations for Other Services OT consult     Precautions / Restrictions Precautions Precautions: Fall Restrictions Weight Bearing Restrictions: No      Mobility  Bed Mobility Overal bed mobility: Modified Independent                Transfers Overall transfer level: Needs assistance   Transfers: Sit to/from Stand Sit to Stand: Supervision         General transfer comment: takes increased time and needs line management  Ambulation/Gait Ambulation/Gait assistance: Min assist Gait Distance (Feet): 148 Feet Assistive device: 1 person hand held assist;None Gait Pattern/deviations: Step-through pattern Gait velocity: decreased   General Gait Details: ambulated approx 149ft with no AD at first but half way pt started feeling fatigued and noted to be shaky hence HHA and min a to complete distance  Stairs            Wheelchair  Mobility    Modified Rankin (Stroke Patients Only)       Balance Overall balance assessment: Needs assistance Sitting-balance support: Feet supported Sitting balance-Leahy Scale: Good     Standing balance support: Single extremity supported;During functional activity(HHA) Standing balance-Leahy Scale: Fair Standing balance comment: noted shakiness with increased distance ambulated                             Pertinent Vitals/Pain Pain Assessment: No/denies pain    Home Living Family/patient expects to be discharged to:: Private residence Living Arrangements: Spouse/significant other Available Help at Discharge: Family Type of Home: House       Home Layout: Two level;Able to live on main level with bedroom/bathroom        Prior Function Level of Independence: Independent               Hand Dominance        Extremity/Trunk Assessment   Upper Extremity Assessment Upper Extremity Assessment: Defer to OT evaluation    Lower Extremity Assessment Lower Extremity Assessment: Generalized weakness    Cervical / Trunk Assessment Cervical / Trunk Assessment: Normal  Communication   Communication: No difficulties  Cognition Arousal/Alertness: Awake/alert Behavior During Therapy: WFL for tasks assessed/performed Overall Cognitive Status: Within Functional Limits for tasks assessed  General Comments General comments (skin integrity, edema, etc.): Pt admitted with sepsis initially then was found to have COVID +. PTA Pt was living with spouse, whom is also hospitalized, and was very independent. Today pt presented with overall decreased strength, balance and coordination and activity tolerance. She is on room air and is able to maintain 02 sats >90 thoughout but HR increases to 110 and RR to 34.     Exercises     Assessment/Plan    PT Assessment Patient needs continued PT services(while in  hospital)  PT Problem List Decreased strength;Decreased activity tolerance;Decreased balance;Decreased mobility;Decreased coordination;Decreased safety awareness       PT Treatment Interventions Gait training;Functional mobility training;Therapeutic activities;Therapeutic exercise;Balance training;Neuromuscular re-education;Patient/family education    PT Goals (Current goals can be found in the Care Plan section)  Acute Rehab PT Goals Patient Stated Goal: go home with spouse PT Goal Formulation: With patient Time For Goal Achievement: 10/17/19 Potential to Achieve Goals: Good    Frequency Min 3X/week   Barriers to discharge Other (comment) lives home with spouse who is also hospitalized    Co-evaluation               AM-PAC PT "6 Clicks" Mobility  Outcome Measure Help needed turning from your back to your side while in a flat bed without using bedrails?: None Help needed moving from lying on your back to sitting on the side of a flat bed without using bedrails?: None Help needed moving to and from a bed to a chair (including a wheelchair)?: A Little Help needed standing up from a chair using your arms (e.g., wheelchair or bedside chair)?: None Help needed to walk in hospital room?: A Little Help needed climbing 3-5 steps with a railing? : A Lot 6 Click Score: 20    End of Session   Activity Tolerance: Treatment limited secondary to medical complications (Comment);Patient limited by fatigue Patient left: in bed;with call bell/phone within reach Nurse Communication: Mobility status PT Visit Diagnosis: Muscle weakness (generalized) (M62.81);Unsteadiness on feet (R26.81)    Time: 2355-7322 PT Time Calculation (min) (ACUTE ONLY): 15 min   Charges:   PT Evaluation $PT Eval Moderate Complexity: Gayle Mill, PT   Delford Field 10/03/2019, 4:03 PM

## 2019-10-03 NOTE — ED Notes (Signed)
Initially called and gave report to accepting nurse and [facility] at 0059. Called [facility] back for an ETA and they said "she's next on the board" and that they would call when they were on their way but did not give an actually time frame.

## 2019-10-03 NOTE — ED Notes (Signed)
Phlebotomy at bedside.

## 2019-10-03 NOTE — ED Notes (Signed)
ED TO INPATIENT HANDOFF REPORT  ED Nurse Name and Phone #: Cinda QuestHaleigh, RN [phone]  S Name/Age/Gender Donna BongoSusan L Wheeler 58 y.o. female Room/Bed: WA24/WA24  Code Status   Code Status: Full Code  Home/SNF/Other Home self, place, time and situation Is this baseline? Yes   Triage Complete: Triage complete  Chief Complaint Covid Positive  Triage Note Patient is from home and has had nausea, vomiting and diarrhea for the last 10 days. She tested positive for Covid 19 on Friday. EMS administered 4mg  of Zofran and 500 cc of fluid.     EMS vitals: 112/62 BP 92% O2 sat on room air  90 HR    Allergies Allergies  Allergen Reactions  . Penicillins Itching    Level of Care/Admitting Diagnosis ED Disposition    ED Disposition Condition Comment   Admit  [facility] [[id number]]  Level of Care: Telemetry [5]  Covid Evaluation: Confirmed COVID Positive  Diagnosis: Dehydration [276.51.ICD-9-CM]  Admitting Physician: Eduard ClosKAKRAKANDY, ARSHAD N [[id number]]  Attending Physician: Eduard ClosKAKRAKANDY, ARSHAD N [phone][[id number]]  Estimated length of stay: past midnight tomorrow  Certification:: I certify this patient will need inpatient services for at least 2 midnights  PT Class (Do Not Modify): Inpatient [101]  PT Acc Code (Do Not Modify): Private [1]       B Medical/Surgery History Past Medical History:  Diagnosis Date  . Anxiety   . Depression   . Hypothyroid    Past Surgical History:  Procedure Laterality Date  . CESAREAN SECTION  1993     A IV Location/Drains/Wounds Patient Lines/Drains/Airways Status   Active Line/Drains/Airways    Name:   Placement date:   Placement time:   Site:   Days:   Peripheral IV 10/02/19 Right Forearm   10/02/19    2112    Forearm   1   Peripheral IV 10/03/19 Left;Posterior Forearm   10/03/19    0456    Forearm   less than 1          Intake/Output Last 24 hours  Intake/Output Summary (Last 24 hours) at 10/03/2019 0509 Last data filed  at 10/03/2019 0350 Gross per 24 hour  Intake 2100 ml  Output -  Net 2100 ml    Labs/Imaging Results for orders placed or performed during the hospital encounter of 10/02/19 (from the past 48 hour(s))  CBC with Differential/Platelet     Status: Abnormal   Collection Time: 10/02/19  9:02 PM  Result Value Ref Range   WBC 4.7 4.0 - 10.5 K/uL   RBC 3.84 (L) 3.87 - 5.11 MIL/uL   Hemoglobin 12.6 12.0 - 15.0 g/dL   HCT 21.336.7 08.636.0 - 57.846.0 %   MCV 95.6 80.0 - 100.0 fL   MCH 32.8 26.0 - 34.0 pg   MCHC 34.3 30.0 - 36.0 g/dL   RDW 46.912.3 62.911.5 - 52.815.5 %   Platelets 181 150 - 400 K/uL   nRBC 0.0 0.0 - 0.2 %   Neutrophils Relative % 72 %   Neutro Abs 3.4 1.7 - 7.7 K/uL   Lymphocytes Relative 23 %   Lymphs Abs 1.1 0.7 - 4.0 K/uL   Monocytes Relative 5 %   Monocytes Absolute 0.2 0.1 - 1.0 K/uL   Eosinophils Relative 0 %   Eosinophils Absolute 0.0 0.0 - 0.5 K/uL   Basophils Relative 0 %   Basophils Absolute 0.0 0.0 - 0.1 K/uL   Immature Granulocytes 0 %   Abs Immature Granulocytes 0.02 0.00 - 0.07  K/uL   Reactive, Benign Lymphocytes PRESENT     Comment: Performed at [facility], [address] [place], [place] [address]  Comprehensive metabolic panel     Status: Abnormal   Collection Time: 10/02/19  9:02 PM  Result Value Ref Range   Sodium 134 (L) 135 - 145 mmol/L   Potassium 2.9 (L) 3.5 - 5.1 mmol/L   Chloride 103 98 - 111 mmol/L   CO2 19 (L) 22 - 32 mmol/L   Glucose, Bld 85 70 - 99 mg/dL   BUN 15 6 - 20 mg/dL   Creatinine, Ser 6.21 0.44 - 1.00 mg/dL   Calcium 7.7 (L) 8.9 - 10.3 mg/dL   Total Protein 7.2 6.5 - 8.1 g/dL   Albumin 3.1 (L) 3.5 - 5.0 g/dL   AST 47 (H) 15 - 41 U/L   ALT 37 0 - 44 U/L   Alkaline Phosphatase 206 (H) 38 - 126 U/L   Total Bilirubin 1.0 0.3 - 1.2 mg/dL   GFR calc non Af Amer >60 >60 mL/min   GFR calc Af Amer >60 >60 mL/min   Anion gap 12 5 - 15    Comment: Performed at [facility], [address] [place], [place]  [address]  Lipase, blood     Status: None   Collection Time: 10/02/19  9:02 PM  Result Value Ref Range   Lipase 43 11 - 51 U/L    Comment: Performed at [facility], [address], [place], [place] [address]  Lactic acid, plasma     Status: None   Collection Time: 10/02/19  9:02 PM  Result Value Ref Range   Lactic Acid, Venous 0.8 0.5 - 1.9 mmol/L    Comment: Performed at [facility], [address], [place], [place] [address]  D-dimer, quantitative     Status: Abnormal   Collection Time: 10/02/19  9:02 PM  Result Value Ref Range   D-Dimer, Quant 0.94 (H) 0.00 - 0.50 ug/mL-FEU    Comment: (NOTE) At the manufacturer cut-off of 0.50 ug/mL FEU, this assay has been documented to exclude PE with a sensitivity and negative predictive value of 97 to 99%.  At this time, this assay has not been approved by the FDA to exclude DVT/VTE. Results should be correlated with clinical presentation. Performed at [facility], [address] [place], [place] [address]   Procalcitonin     Status: None   Collection Time: 10/02/19  9:02 PM  Result Value Ref Range   Procalcitonin 1.58 ng/mL    Comment:        Interpretation: PCT > 0.5 ng/mL and <= 2 ng/mL: Systemic infection (sepsis) is possible, but other conditions are known to elevate PCT as well. (NOTE)       Sepsis PCT Algorithm           Lower Respiratory Tract                                      Infection PCT Algorithm    ----------------------------     ----------------------------         PCT < 0.25 ng/mL                PCT < 0.10 ng/mL         Strongly encourage  Strongly discourage   discontinuation of antibiotics    initiation of antibiotics    ----------------------------     -----------------------------       PCT 0.25 - 0.50 ng/mL            PCT 0.10 - 0.25 ng/mL               OR       >80% decrease in PCT            Discourage initiation of                                             antibiotics      Encourage discontinuation           of antibiotics    ----------------------------     -----------------------------         PCT >= 0.50 ng/mL              PCT 0.26 - 0.50 ng/mL                AND       <80% decrease in PCT             Encourage initiation of                                             antibiotics       Encourage continuation           of antibiotics    ----------------------------     -----------------------------        PCT >= 0.50 ng/mL                  PCT > 0.50 ng/mL               AND         increase in PCT                  Strongly encourage                                      initiation of antibiotics    Strongly encourage escalation           of antibiotics                                     -----------------------------                                           PCT <= 0.25 ng/mL                                                 OR                                        >  80% decrease in PCT                                     Discontinue / Do not initiate                                             antibiotics Performed at [place] [address], [place], [place] [address]   Lactate dehydrogenase     Status: Abnormal   Collection Time: 10/02/19  9:02 PM  Result Value Ref Range   LDH 265 (H) 98 - 192 U/L    Comment: Performed at [facility], [place] [address] [place], [place] [address]  Ferritin     Status: Abnormal   Collection Time: 10/02/19  9:02 PM  Result Value Ref Range   Ferritin 966 (H) 11 - 307 ng/mL    Comment: Performed at [facility], [place] [address], [place], [place] [address]  Triglycerides     Status: Abnormal   Collection Time: 10/02/19  9:02 PM  Result Value Ref Range   Triglycerides 178 (H) <150 mg/dL    Comment: Performed at [facility], [place] [address] [place], [place] [address]  Fibrinogen     Status: Abnormal    Collection Time: 10/02/19  9:02 PM  Result Value Ref Range   Fibrinogen >800 (H) 210 - 475 mg/dL    Comment: Performed at [facility], [place] [address], [place], [place] [address]  C-reactive protein     Status: Abnormal   Collection Time: 10/02/19  9:02 PM  Result Value Ref Range   CRP 22.6 (H) <1.0 mg/dL    Comment: Performed at [facility], [place] [address], [place], [place] [address]  I-Stat beta hCG blood, ED     Status: None   Collection Time: 10/02/19  9:32 PM  Result Value Ref Range   I-stat hCG, quantitative <5.0 <5 mIU/mL   Comment 3            Comment:   GEST. AGE      CONC.  (mIU/mL)   <=1 WEEK        5 - 50     2 WEEKS       50 - 500     3 WEEKS       100 - 10,000     4 WEEKS     1,000 - 30,000        FEMALE AND NON-PREGNANT FEMALE:     LESS THAN 5 mIU/mL   Lactic acid, plasma     Status: None   Collection Time: 10/02/19 11:06 PM  Result Value Ref Range   Lactic Acid, Venous 0.6 0.5 - 1.9 mmol/L    Comment: Performed at [facility], [place] [address] [place], [place] [address]   Dg Chest Port 1 View  Result Date: 10/02/2019 CLINICAL DATA:  Cough, shortness of breath, covered positive EXAM: PORTABLE CHEST 1 VIEW COMPARISON:  None. FINDINGS: Multifocal areas of interstitial and airspace opacity throughout both lungs most pronounced in the right lung periphery. No pneumothorax or effusion. The cardiomediastinal contours are unremarkable. Degenerative changes are present in the imaged spine and shoulders. No acute osseous or soft tissue abnormality. Coil projects over  the left mid lung, likely fiducial marker. IMPRESSION: Multifocal areas of interstitial and airspace opacity throughout both lungs, most pronounced in the right lung periphery, compatible with multifocal pneumonia including COVID-19 etiology. Electronically Signed   By: Kreg Shropshire M.D.   On: 10/02/2019 20:38   Dg Abd Portable 1 View  Result Date:  10/02/2019 CLINICAL DATA:  Diarrhea EXAM: PORTABLE ABDOMEN - 1 VIEW COMPARISON:  None. FINDINGS: The bowel gas pattern is normal. No radio-opaque calculi or other significant radiographic abnormality are seen. IMPRESSION: Negative. Electronically Signed   By: Duanne Guess M.D.   On: 10/02/2019 20:38    Pending Labs Unresulted Labs (From admission, onward)    Start     Ordered   10/10/19 0500  Creatinine, serum  (enoxaparin (LOVENOX)    CrCl >/= 30 ml/min)  Weekly,   R    Comments: while on enoxaparin therapy    10/03/19 0318   10/03/19 0321  Ferritin  Once,   STAT     10/03/19 0320   10/03/19 0321  C-reactive protein  Once,   STAT     10/03/19 0320   10/03/19 0321  Magnesium  Once,   STAT     10/03/19 0320   10/03/19 0318  HIV Antibody (routine testing w rflx)  (HIV Antibody (Routine testing w reflex) panel)  Once,   STAT     10/03/19 0318   10/03/19 0318  HIV4GL Save Tube  (HIV Antibody (Routine testing w reflex) panel)  Once,   STAT     10/03/19 0318   10/03/19 0318  CBC  (enoxaparin (LOVENOX)    CrCl >/= 30 ml/min)  Once,   STAT    Comments: Baseline for enoxaparin therapy IF NOT ALREADY DRAWN.  Notify MD if PLT < 100 K.    10/03/19 0318   10/03/19 0318  Creatinine, serum  (enoxaparin (LOVENOX)    CrCl >/= 30 ml/min)  Once,   STAT    Comments: Baseline for enoxaparin therapy IF NOT ALREADY DRAWN.    10/03/19 0318   10/02/19 1948  Blood Culture (routine x 2)  BLOOD CULTURE X 2,   STAT     10/02/19 1947   10/02/19 1948  SARS CORONAVIRUS 2 (TAT 6-24 HRS) Nasopharyngeal Nasopharyngeal Swab  (Novel Coronavirus, [facility] Order))  Once,   STAT    Question Answer Comment  Is this test for diagnosis or screening Diagnosis of ill patient   Symptomatic for COVID-19 as defined by CDC Yes   Date of Symptom Onset 09/17/2019   Hospitalized for COVID-19 Yes   Admitted to ICU for COVID-19 No   Previously tested for COVID-19 No   Resident in a congregate (group) care setting No    Employed in healthcare setting No   Pregnant No      10/02/19 1947          Vitals/Pain Today's Vitals   10/03/19 0200 10/03/19 0230 10/03/19 0300 10/03/19 0400  BP: 103/67 (!) 109/59 117/72   Pulse: 65 69 78   Resp: (!) 24 19 16    Temp:    99.8 F (37.7 C)  TempSrc:    Oral  SpO2: (!) 81% 95% 99%   Weight:      Height:      PainSc:        Isolation Precautions Airborne and Contact precautions  Medications Medications  acetaminophen (TYLENOL) tablet 650 mg (has no administration in time range)    Or  acetaminophen (TYLENOL) suppository  650 mg (has no administration in time range)  ondansetron (ZOFRAN) tablet 4 mg (has no administration in time range)    Or  ondansetron (ZOFRAN) injection 4 mg (has no administration in time range)  enoxaparin (LOVENOX) injection 40 mg (has no administration in time range)  lactated ringers 1,000 mL with potassium chloride 20 mEq infusion ( Intravenous New Bag/Given 10/03/19 0451)  sodium chloride 0.9 % bolus 1,000 mL (0 mLs Intravenous Stopped 10/02/19 2317)  acetaminophen (TYLENOL) tablet 650 mg (650 mg Oral Given 10/02/19 2102)  potassium chloride 10 mEq in 100 mL IVPB (0 mEq Intravenous Stopped 10/03/19 0005)  potassium chloride SA (KLOR-CON) CR tablet 40 mEq (40 mEq Oral Given 10/02/19 2302)  zolpidem (AMBIEN) tablet 5 mg (5 mg Oral Given 10/03/19 0422)  acetaminophen (TYLENOL) tablet 650 mg (650 mg Oral Given 10/03/19 0422)    Mobility manual wheelchair Low fall risk   Focused Assessments Pulmonary Assessment Handoff:  Lung sounds:   O2 Device: Nasal Cannula O2 Flow Rate (L/min): 2 L/min      R Recommendations: See Admitting Provider Note  Report given to: charge nurse for 1C Pulmonary Care at [facility]  Additional Notes:  Report called and given, carelink notified for transport.

## 2019-10-03 NOTE — ED Provider Notes (Signed)
[facility] A Provider Note   CSN: [id number] Arrival date & time: 10/02/19  1836     History   Chief Complaint Chief Complaint  Patient presents with  . Nausea  . Emesis  . Diarrhea    HPI Donna Wheeler is a 58 y.o. female history of anxiety, depression here presenting with dehydration, positive Covid.  Patient states that about 10 days ago, she noticed some nausea and vomiting and chills .  She was tested positive for Covid about a week ago.  Since then she states that her symptoms got worse.  She has some subjective shortness of breath as well.  She also had worsening epigastric pain and vomiting.  Patient was noted to have oxygen saturation of 92% on room air per EMS .  She was given 500 cc bolus in route.    The history is provided by the patient.    Past Medical History:  Diagnosis Date  . Anxiety   . Depression   . Hypothyroid     Patient Active Problem List   Diagnosis Date Noted  . Diarrhea 10/03/2019  . Dehydration 10/03/2019  . Hypokalemia 10/03/2019  . COVID-19 virus infection 10/02/2019  . Hypothyroid   . Depression   . Anxiety     Past Surgical History:  Procedure Laterality Date  . CESAREAN SECTION  1993     OB History    Gravida  1   Para  1   Term  1   Preterm      AB      Living  1     SAB      TAB      Ectopic      Multiple      Live Births  1            Home Medications    Prior to Admission medications   Medication Sig Start Date End Date Taking? Authorizing Provider  escitalopram (LEXAPRO) 20 MG tablet Take 20 mg by mouth daily. 09/12/18  Yes [provider]  levothyroxine (SYNTHROID, LEVOTHROID) 112 MCG tablet TAKE 1 TABLET EVERY DAY Patient taking differently: Take 112 mcg by mouth daily before breakfast.  05/10/15  Yes Harrington ChallengerYoung, Nancy J, NP    Family History Family History  Problem Relation Age of Onset  . Hypertension Father   . Hypertension Brother   . Diabetes Brother    . Breast cancer Paternal Aunt [id number]  . Hypertension Mother     Social History Social History   Tobacco Use  . Smoking status: Never Smoker  . Smokeless tobacco: Never Used  Substance Use Topics  . Alcohol use: No    Alcohol/week: 0.0 standard drinks  . Drug use: No     Allergies   Penicillins   Review of Systems Review of Systems  Gastrointestinal: Positive for diarrhea and vomiting.  All other systems reviewed and are negative.    Physical Exam Updated Vital Signs BP 119/65 (BP Location: Left Arm)   Pulse 78   Temp 99.1 F (37.3 C) (Oral)   Resp (!) 21   Ht 5\' 5"  (1.651 m)   Wt 77.6 kg   SpO2 95%   BMI 28.46 kg/m   Physical Exam Vitals signs and nursing note reviewed.  Constitutional:      Comments: Dehydrated, chronically ill   HENT:     Head: Normocephalic.     Mouth/Throat:     Mouth: Mucous membranes are dry.  Eyes:     Extraocular Movements: Extraocular movements intact.     Pupils: Pupils are equal, round, and reactive to light.  Neck:     Musculoskeletal: Normal range of motion.  Cardiovascular:     Rate and Rhythm: Regular rhythm.     Comments: Tachycardic  Pulmonary:     Comments: tachypneic  Abdominal:     General: Abdomen is flat.     Palpations: Abdomen is soft.  Musculoskeletal: Normal range of motion.  Skin:    General: Skin is warm.     Capillary Refill: Capillary refill takes less than 2 seconds.  Neurological:     General: No focal deficit present.     Mental Status: She is oriented to person, place, and time.  Psychiatric:        Mood and Affect: Mood normal.        Behavior: Behavior normal.      ED Treatments / Results  Labs (all labs ordered are listed, but only abnormal results are displayed) Labs Reviewed  SARS CORONAVIRUS 2 (TAT 6-24 HRS) - Abnormal; Notable for the following components:      Result Value   SARS Coronavirus 2 POSITIVE (*)    All other components within normal limits  CBC WITH  DIFFERENTIAL/PLATELET - Abnormal; Notable for the following components:   RBC 3.84 (*)    All other components within normal limits  COMPREHENSIVE METABOLIC PANEL - Abnormal; Notable for the following components:   Sodium 134 (*)    Potassium 2.9 (*)    CO2 19 (*)    Calcium 7.7 (*)    Albumin 3.1 (*)    AST 47 (*)    Alkaline Phosphatase 206 (*)    All other components within normal limits  D-DIMER, QUANTITATIVE (NOT AT [facility]) - Abnormal; Notable for the following components:   D-Dimer, Quant 0.94 (*)    All other components within normal limits  LACTATE DEHYDROGENASE - Abnormal; Notable for the following components:   LDH 265 (*)    All other components within normal limits  FERRITIN - Abnormal; Notable for the following components:   Ferritin 966 (*)    All other components within normal limits  TRIGLYCERIDES - Abnormal; Notable for the following components:   Triglycerides 178 (*)    All other components within normal limits  FIBRINOGEN - Abnormal; Notable for the following components:   Fibrinogen >800 (*)    All other components within normal limits  C-REACTIVE PROTEIN - Abnormal; Notable for the following components:   CRP 22.6 (*)    All other components within normal limits  FERRITIN - Abnormal; Notable for the following components:   Ferritin 731 (*)    All other components within normal limits  C-REACTIVE PROTEIN - Abnormal; Notable for the following components:   CRP 18.0 (*)    All other components within normal limits  CBC - Abnormal; Notable for the following components:   RBC 3.64 (*)    Hemoglobin 11.6 (*)    HCT 35.0 (*)    All other components within normal limits  CULTURE, BLOOD (ROUTINE X 2)  CULTURE, BLOOD (ROUTINE X 2)  URINE CULTURE  LIPASE, BLOOD  LACTIC ACID, PLASMA  LACTIC ACID, PLASMA  PROCALCITONIN  CREATININE, SERUM  MAGNESIUM  HIV ANTIBODY (ROUTINE TESTING W REFLEX)  HIV4GL SAVE TUBE  I-STAT BETA HCG BLOOD, ED ([facility], [facility], [facility] ONLY)   TROPONIN I (HIGH SENSITIVITY)    EKG EKG Interpretation  Date/Time:  Friday  October 02 2019 19:09:57 EDT Ventricular Rate:  87 PR Interval:    QRS Duration: 94 QT Interval:  357 QTC Calculation: 430 R Axis:   19 Text Interpretation:  Sinus rhythm Borderline repolarization abnormality No previous ECGs available Confirmed by Richardean Canal ([id number]) on 10/02/2019 7:49:22 PM   Radiology Dg Chest Port 1 View  Result Date: 10/02/2019 CLINICAL DATA:  Cough, shortness of breath, covered positive EXAM: PORTABLE CHEST 1 VIEW COMPARISON:  None. FINDINGS: Multifocal areas of interstitial and airspace opacity throughout both lungs most pronounced in the right lung periphery. No pneumothorax or effusion. The cardiomediastinal contours are unremarkable. Degenerative changes are present in the imaged spine and shoulders. No acute osseous or soft tissue abnormality. Coil projects over the left mid lung, likely fiducial marker. IMPRESSION: Multifocal areas of interstitial and airspace opacity throughout both lungs, most pronounced in the right lung periphery, compatible with multifocal pneumonia including COVID-19 etiology. Electronically Signed   By: Kreg Shropshire M.D.   On: 10/02/2019 20:38   Dg Abd Portable 1 View  Result Date: 10/02/2019 CLINICAL DATA:  Diarrhea EXAM: PORTABLE ABDOMEN - 1 VIEW COMPARISON:  None. FINDINGS: The bowel gas pattern is normal. No radio-opaque calculi or other significant radiographic abnormality are seen. IMPRESSION: Negative. Electronically Signed   By: Duanne Guess M.D.   On: 10/02/2019 20:38    Procedures Procedures (including critical care time)  CRITICAL CARE Performed by: Richardean Canal   Total critical care time:30  minutes  Critical care time was exclusive of separately billable procedures and treating other patients.  Critical care was necessary to treat or prevent imminent or life-threatening deterioration.  Critical care was time spent personally by me  on the following activities: development of treatment plan with patient and/or surrogate as well as nursing, discussions with consultants, evaluation of patient's response to treatment, examination of patient, obtaining history from patient or surrogate, ordering and performing treatments and interventions, ordering and review of laboratory studies, ordering and review of radiographic studies, pulse oximetry and re-evaluation of patient's condition.   Medications Ordered in ED Medications  ondansetron (ZOFRAN) injection 4 mg (has no administration in time range)  enoxaparin (LOVENOX) injection 40 mg (40 mg Subcutaneous Given 10/03/19 1152)  lactated ringers 1,000 mL with potassium chloride 20 mEq infusion ( Intravenous New Bag/Given 10/03/19 1152)  remdesivir 200 mg in sodium chloride 0.9 % 250 mL IVPB (0 mg Intravenous Stopped 10/03/19 1039)    Followed by  remdesivir 100 mg in sodium chloride 0.9 % 250 mL IVPB (has no administration in time range)  methylPREDNISolone sodium succinate (SOLU-MEDROL) 125 mg/2 mL injection 60 mg (60 mg Intravenous Given 10/03/19 1244)  albuterol (VENTOLIN HFA) 108 (90 Base) MCG/ACT inhaler 2 puff (has no administration in time range)  levothyroxine (SYNTHROID) tablet 112 mcg (112 mcg Oral Given 10/03/19 1318)  escitalopram (LEXAPRO) tablet 20 mg (20 mg Oral Given 10/03/19 1318)  guaiFENesin-dextromethorphan (ROBITUSSIN DM) 100-10 MG/5ML syrup 5 mL (5 mLs Oral Given 10/03/19 1324)  sodium chloride 0.9 % bolus 1,000 mL (0 mLs Intravenous Stopped 10/02/19 2317)  acetaminophen (TYLENOL) tablet 650 mg (650 mg Oral Given 10/02/19 2102)  potassium chloride 10 mEq in 100 mL IVPB (0 mEq Intravenous Stopped 10/03/19 0005)  potassium chloride SA (KLOR-CON) CR tablet 40 mEq (40 mEq Oral Given 10/02/19 2302)  zolpidem (AMBIEN) tablet 5 mg (5 mg Oral Given 10/03/19 0422)  acetaminophen (TYLENOL) tablet 650 mg (650 mg Oral Given 10/03/19 0422)  piperacillin-tazobactam (ZOSYN)  IVPB 3.375 g (  0 g Intravenous Stopped 10/03/19 1039)  vancomycin (VANCOCIN) 1,750 mg in sodium chloride 0.9 % 500 mL IVPB (1,750 mg Intravenous New Bag/Given 10/03/19 0747)     Initial Impression / Assessment and Plan / ED Course  I have reviewed the triage vital signs and the nursing notes.  Pertinent labs & imaging results that were available during my care of the patient were reviewed by me and considered in my medical decision making (see chart for details).       Donna Wheeler is a 58 y.o. female with recent positive Covid here presenting with nausea vomiting and dehydration. she is hypotensive initially.  She appears to be very dehydrated.  I suspect that her symptoms are secondary to Covid infection. Will order Covid preadmission labs.   10 pm Inflammatory markers very elevated. Potassium is 2.9, supplemented. BP improved with IVF. Will admit for COVID, hypokalemia, dehydration with hypotension.   Final Clinical Impressions(s) / ED Diagnoses   Final diagnoses:  [id number] virus detected  Hypokalemia    ED Discharge Orders    None       Drenda Freeze, MD 10/03/19 1644

## 2019-10-03 NOTE — ED Notes (Signed)
Now informed [facility] will not be coming to get this pt to transfer to [facility] until after shift change.

## 2019-10-03 NOTE — H&P (Signed)
History and Physical    Donna Wheeler NTZ:[id number] DOB: January 13, 1961 DOA: 10/02/2019  PCP: Laurann Montana, MD  Patient coming from: Home.  Chief Complaint: Cough nausea vomiting and diarrhea.  HPI: Donna Wheeler is a 58 y.o. female with history of hypothyroidism who was diagnosed with COVID 19 about a week ago has been having persistent vomiting and diarrhea over the last 1 week.  Patient has been also was diagnosed with COVID-19 and is admitted at [facility].  Patient states he initial symptom was persistent cough and this made her go to her primary care who tested for COVID-19 and was positive.  Over the last few days patient has become progressively weak.  Persistent cough and had at least 2-3 episodes of diarrhea but denies any abdominal pain.  ED Course: In the ER labs show potassium 2.9 sodium 134 creatinine 1.7 albumin 3.1 AST 47 LDH 265 triglyceride 178 ferritin 966 CRP 22.6 procalcitonin 1.58 lactic acid 0.8 platelets 181 fibrinogen more than 800 KUB does not show any acute.  Abdomen appears benign.  Chest x-ray shows multifocal pneumonia.  Patient admitted for COVID-19 infection with diarrhea and pneumonia.  While in the ER patient started having chills and rigors but temperature was around 99 F.  Blood cultures obtained.  Patient blood pressure initially was in the low normal.  Improved with fluids.  Potassium replacement was given.  Review of Systems: As per HPI, rest all negative.   Past Medical History:  Diagnosis Date   Anxiety    Depression    Hypothyroid     Past Surgical History:  Procedure Laterality Date   CESAREAN SECTION  1993     reports that she has never smoked. She has never used smokeless tobacco. She reports that she does not drink alcohol or use drugs.  Allergies  Allergen Reactions   Penicillins Itching    Family History  Problem Relation Age of Onset   Hypertension Father    Hypertension Brother    Diabetes Brother     Breast cancer Paternal Aunt 34   Hypertension Mother     Prior to Admission medications   Medication Sig Start Date End Date Taking? Authorizing Provider  escitalopram (LEXAPRO) 20 MG tablet Take 20 mg by mouth daily. 09/12/18  Yes [provider]  levothyroxine (SYNTHROID, LEVOTHROID) 112 MCG tablet TAKE 1 TABLET EVERY DAY Patient taking differently: Take 112 mcg by mouth daily before breakfast.  05/10/15  Yes Harrington Challenger, NP    Physical Exam: Constitutional: Moderately built and nourished. Vitals:   10/03/19 0100 10/03/19 0130 10/03/19 0200 10/03/19 0230  BP: 106/60 (!) 92/47 103/67 (!) 109/59  Pulse: 74 72 65 69  Resp: (!) 24 (!) 23 (!) 24 19  Temp:      TempSrc:      SpO2: 91% 94% (!) 81% 95%  Weight:      Height:       Eyes: Anicteric no pallor. ENMT: No discharge from the ears eyes nose or mouth. Neck: No mass felt.  No neck rigidity. Respiratory: No rhonchi or crepitations. Cardiovascular: S1-S2 heard. Abdomen: Soft nontender bowel sounds present. Musculoskeletal: No edema. Skin: No rash. Neurologic: Alert awake oriented to time place and person.  Moves all extremities. Psychiatric: Appears normal per normal affect.   Labs on Admission: I have personally reviewed following labs and imaging studies  CBC: Recent Labs  Lab 10/02/19 2102  WBC 4.7  NEUTROABS 3.4  HGB 12.6  HCT 36.7  MCV 95.6  PLT 181   Basic Metabolic Panel: Recent Labs  Lab 10/02/19 2102  NA 134*  K 2.9*  CL 103  CO2 19*  GLUCOSE 85  BUN 15  CREATININE 0.79  CALCIUM 7.7*   GFR: Estimated Creatinine Clearance: 79.9 mL/min (by C-G formula based on SCr of 0.79 mg/dL). Liver Function Tests: Recent Labs  Lab 10/02/19 2102  AST 47*  ALT 37  ALKPHOS 206*  BILITOT 1.0  PROT 7.2  ALBUMIN 3.1*   Recent Labs  Lab 10/02/19 2102  LIPASE 43   No results for input(s): AMMONIA in the last 168 hours. Coagulation Profile: No results for input(s): INR, PROTIME in the last  168 hours. Cardiac Enzymes: No results for input(s): CKTOTAL, CKMB, CKMBINDEX, TROPONINI in the last 168 hours. BNP (last 3 results) No results for input(s): PROBNP in the last 8760 hours. HbA1C: No results for input(s): HGBA1C in the last 72 hours. CBG: No results for input(s): GLUCAP in the last 168 hours. Lipid Profile: Recent Labs    10/02/19 2102  TRIG 178*   Thyroid Function Tests: No results for input(s): TSH, T4TOTAL, FREET4, T3FREE, THYROIDAB in the last 72 hours. Anemia Panel: Recent Labs    10/02/19 2102  FERRITIN 966*   Urine analysis:    Component Value Date/Time   COLORURINE YELLOW 05/10/2015 0935   APPEARANCEUR CLEAR 05/10/2015 0935   LABSPEC 1.029 05/10/2015 0935   PHURINE 5.5 05/10/2015 0935   GLUCOSEU NEG 05/10/2015 0935   HGBUR NEG 05/10/2015 0935   BILIRUBINUR NEG 05/10/2015 0935   KETONESUR NEG 05/10/2015 0935   PROTEINUR NEG 05/10/2015 0935   UROBILINOGEN 1 05/10/2015 0935   NITRITE NEG 05/10/2015 0935   LEUKOCYTESUR NEG 05/10/2015 0935   Sepsis Labs: @LABRCNTIP (procalcitonin:4,lacticidven:4) )No results found for this or any previous visit (from the past 240 hour(s)).   Radiological Exams on Admission: Dg Chest Port 1 View  Result Date: 10/02/2019 CLINICAL DATA:  Cough, shortness of breath, covered positive EXAM: PORTABLE CHEST 1 VIEW COMPARISON:  None. FINDINGS: Multifocal areas of interstitial and airspace opacity throughout both lungs most pronounced in the right lung periphery. No pneumothorax or effusion. The cardiomediastinal contours are unremarkable. Degenerative changes are present in the imaged spine and shoulders. No acute osseous or soft tissue abnormality. Coil projects over the left mid lung, likely fiducial marker. IMPRESSION: Multifocal areas of interstitial and airspace opacity throughout both lungs, most pronounced in the right lung periphery, compatible with multifocal pneumonia including COVID-19 etiology. Electronically Signed    By: Kreg ShropshirePrice  DeHay M.D.   On: 10/02/2019 20:38   Dg Abd Portable 1 View  Result Date: 10/02/2019 CLINICAL DATA:  Diarrhea EXAM: PORTABLE ABDOMEN - 1 VIEW COMPARISON:  None. FINDINGS: The bowel gas pattern is normal. No radio-opaque calculi or other significant radiographic abnormality are seen. IMPRESSION: Negative. Electronically Signed   By: Duanne GuessNicholas  Plundo M.D.   On: 10/02/2019 20:38    EKG: Independently reviewed.  Normal sinus rhythm.  Assessment/Plan Principal Problem:   COVID-19 virus infection Active Problems:   Hypothyroid   Diarrhea   Dehydration   Hypokalemia    1. Dehydration with hypokalemia and nausea vomiting and diarrhea from COVID-19 infection -we will continue with IV fluids potassium replacement and closely follow metabolic panel. 2. Multifocal pneumonia with persistent cough -patient appears weak and tired we will keep patient on Decadron will consult pharmacy for Remdesivir. 3. Chills and rigors -have obtain blood cultures.  Since procalcitonin is mildly elevated we will keep patient  on antibiotics until blood cultures obtained.  Check UA and urine culture. 4. Hypothyroidism on Synthroid.  Given that patient has generalized weakness with persistent diarrhea fatigue multifocal pneumonia with COVID-19 will need more than 2 midnight stay in inpatient status.   DVT prophylaxis: Lovenox. Code Status: Full code. Family Communication: Discussed with patient. Disposition Plan: Home. Consults called: None. Admission status: Inpatient.   Rise Patience MD [facility] Pager [phone].  If 7PM-7AM, please contact night-coverage [email or web address] Password [email or web address]  10/03/2019, 3:19 AM

## 2019-10-03 NOTE — Progress Notes (Signed)
Pharmacy Antibiotic Note  Donna Wheeler is a 58 y.o. female admitted on 10/02/2019 with sepsis.  Pharmacy has been consulted for zosyn/vancomycin dosing. Note: PCN allergy = itching. RN to observe closely during 1st dose.   Plan: Zosyn 3.375g IV q8h (4 hour infusion).  Vancomycin 1750 mg IV q24h for est AUC = 490 Goal AUC = 400-550 Daily scr F/u cultures/levels  Height: 5\' 5"  (165.1 cm) Weight: 171 lb (77.6 kg) IBW/kg (Calculated) : 57  Temp (24hrs), Avg:100 F (37.8 C), Min:99.8 F (37.7 C), Max:100.2 F (37.9 C)  Recent Labs  Lab 10/02/19 2102 10/02/19 2306 10/03/19 0318  WBC 4.7  --   --   CREATININE 0.79  --  0.78  LATICACIDVEN 0.8 0.6  --     Estimated Creatinine Clearance: 79.9 mL/min (by C-G formula based on SCr of 0.78 mg/dL).    Allergies  Allergen Reactions  . Penicillins Itching    Antimicrobials this admission: 10/17 zosyn >>  10/17 vancomycin >>   Dose adjustments this admission:   Microbiology results:  BCx:   UCx:    Sputum:    MRSA PCR:   Thank you for allowing pharmacy to be a part of this patient's care.  Dorrene German 10/03/2019 6:15 AM

## 2019-10-03 NOTE — ED Notes (Addendum)
Date and time results received: 10/03/19 9:04 AM  (use smartphrase ".now" to insert current time)  Test: Covid Critical Value: positive   Name of Provider Notified: Ngozi, RN  Orders Received? Or Actions Taken?: Actions Taken: Notified primary RN

## 2019-10-03 NOTE — Progress Notes (Signed)
Pharmacy: Remdesivir   Patient is a 58 y.o. F who was tested positive for COVID a week ago.  Pharmacy has been consulted for remdesivir dosing.   - ALT: 47 - CXR shows: Multifocal areas of interstitial and airspace opacity throughout both lungs, most pronounced in the right lung periphery, compatiblewith multifocal pneumonia including COVID-19 etiology - Pt is requiring supplemental oxygen: 2L [place]    A/P:  - Patient meets criteria for remdesivir. Will initiate remdesivir 200 mg once followed by 100 mg daily x 4 days.  - Daily CMET while on remdesivir - Will f/u pt's ALT and clinical condition  Dia Sitter, PharmD, BCPS 10/03/2019 6:10 AM

## 2019-10-03 NOTE — ED Notes (Signed)
Attempted morning lab draws multiple times, sent down and lab called informing this nurse they all needed to be recollected. Referred to phlebotomy for assistance with lab re-draws due to difficulty.

## 2019-10-04 DIAGNOSIS — E039 Hypothyroidism, unspecified: Secondary | ICD-10-CM

## 2019-10-04 LAB — COMPREHENSIVE METABOLIC PANEL
ALT: 46 U/L — ABNORMAL HIGH (ref 0–44)
AST: 57 U/L — ABNORMAL HIGH (ref 15–41)
Albumin: 2.7 g/dL — ABNORMAL LOW (ref 3.5–5.0)
Alkaline Phosphatase: 191 U/L — ABNORMAL HIGH (ref 38–126)
Anion gap: 9 (ref 5–15)
BUN: 15 mg/dL (ref 6–20)
CO2: 21 mmol/L — ABNORMAL LOW (ref 22–32)
Calcium: 8.2 mg/dL — ABNORMAL LOW (ref 8.9–10.3)
Chloride: 110 mmol/L (ref 98–111)
Creatinine, Ser: 0.59 mg/dL (ref 0.44–1.00)
GFR calc Af Amer: >60 mL/min (ref >60)
GFR calc non Af Amer: >60 mL/min (ref >60)
Glucose, Bld: 157 mg/dL — ABNORMAL HIGH (ref 70–99)
Potassium: 4.3 mmol/L (ref 3.5–5.1)
Sodium: 140 mmol/L (ref 135–145)
Total Bilirubin: 0.2 mg/dL — ABNORMAL LOW (ref 0.3–1.2)
Total Protein: 6.4 g/dL — ABNORMAL LOW (ref 6.5–8.1)

## 2019-10-04 LAB — CBC WITH DIFFERENTIAL/PLATELET
Abs Immature Granulocytes: 0.06 10*3/uL (ref 0.00–0.07)
Basophils Absolute: 0 10*3/uL (ref 0.0–0.1)
Basophils Relative: 0 %
Eosinophils Absolute: 0 10*3/uL (ref 0.0–0.5)
Eosinophils Relative: 0 %
HCT: 33.8 % — ABNORMAL LOW (ref 36.0–46.0)
Hemoglobin: 11.4 g/dL — ABNORMAL LOW (ref 12.0–15.0)
Immature Granulocytes: 1 %
Lymphocytes Relative: 15 %
Lymphs Abs: 0.9 10*3/uL (ref 0.7–4.0)
MCH: 32.2 pg (ref 26.0–34.0)
MCHC: 33.7 g/dL (ref 30.0–36.0)
MCV: 95.5 fL (ref 80.0–100.0)
Monocytes Absolute: 0.1 10*3/uL (ref 0.1–1.0)
Monocytes Relative: 2 %
Neutro Abs: 5 10*3/uL (ref 1.7–7.7)
Neutrophils Relative %: 82 %
Platelets: 203 10*3/uL (ref 150–400)
RBC: 3.54 MIL/uL — ABNORMAL LOW (ref 3.87–5.11)
RDW: 12.3 % (ref 11.5–15.5)
WBC: 6.1 10*3/uL (ref 4.0–10.5)
nRBC: 0 % (ref 0.0–0.2)

## 2019-10-04 LAB — C-REACTIVE PROTEIN: CRP: 16.7 mg/dL — ABNORMAL HIGH (ref <1.0)

## 2019-10-04 LAB — D-DIMER, QUANTITATIVE: D-Dimer, Quant: 0.81 ug/mL-FEU — ABNORMAL HIGH (ref 0.00–0.50)

## 2019-10-04 LAB — MAGNESIUM: Magnesium: 2.2 mg/dL (ref 1.7–2.4)

## 2019-10-04 LAB — BRAIN NATRIURETIC PEPTIDE: B Natriuretic Peptide: 385.5 pg/mL — ABNORMAL HIGH (ref 0.0–100.0)

## 2019-10-04 MED ORDER — SODIUM CHLORIDE 0.9% IV SOLUTION
Freq: Once | INTRAVENOUS | Status: AC
  Administered 2019-10-05: 10 mL/h via INTRAVENOUS

## 2019-10-04 MED ORDER — INFLUENZA VAC SPLIT QUAD 0.5 ML IM SUSY
0.5000 mL | PREFILLED_SYRINGE | INTRAMUSCULAR | Status: DC
  Filled 2019-10-04: qty 0.5

## 2019-10-04 NOTE — Evaluation (Addendum)
Occupational Therapy Evaluation Patient Details Name: Donna Wheeler MRN: [id number] DOB: [birth year] Today's Date: 10/04/2019    History of Present Illness 58 y/o female w/ hx of anxiety, depression, hypothyroid,  dx with COVID approx 10/10 but was sent home. continued to have n/v/d/ and coughing until hospital admit 10/16. Spouse is also hospitalized with COVID at this hospital.   Clinical Impression   This 58 y/o female presents with the above. PTA pt independent with ADL, iADL and functional mobility; reports just recently retired. Pt initially performing room/hallway level functional mobility without AD and supervision-minguard assist; pt fatigues fairly easily and with increased unsteadiness noted when fatigued, requiring at times minA for mobility tasks. Pt overall completing ADL at supervision level. VSS on RA throughout (SpO2 >90%). Initiated education of energy conservation techniques and activity progression after discharge. She will benefit from continued acute OT services to further maximize her safety and independence with ADL and mobility. Will follow.     Follow Up Recommendations  No OT follow up;Supervision - Intermittent    Equipment Recommendations  None recommended by OT           Precautions / Restrictions Precautions Precautions: Fall Restrictions Weight Bearing Restrictions: No      Mobility Bed Mobility Overal bed mobility: Modified Independent                Transfers Overall transfer level: Needs assistance   Transfers: Sit to/from Stand Sit to Stand: Supervision         General transfer comment: for lines and safety    Balance Overall balance assessment: Needs assistance Sitting-balance support: Feet supported Sitting balance-Leahy Scale: Good     Standing balance support: During functional activity;No upper extremity supported Standing balance-Leahy Scale: Fair Standing balance comment: decreased stability when fatigued                            ADL either performed or assessed with clinical judgement   ADL Overall ADL's : Needs assistance/impaired Eating/Feeding: Modified independent;Sitting   Grooming: Min guard;Standing   Upper Body Bathing: Min guard;Sitting   Lower Body Bathing: Min guard;Sit to/from stand   Upper Body Dressing : Set up;Min guard;Sitting;Standing Upper Body Dressing Details (indicate cue type and reason): donning/doffing robe Lower Body Dressing: Min guard;Sit to/from stand Lower Body Dressing Details (indicate cue type and reason): pt adjusting socks via figure 4 technique seated EOB Toilet Transfer: Min guard;Ambulation Toilet Transfer Details (indicate cue type and reason): simulated via transfer to/from EOB Toileting- Clothing Manipulation and Hygiene: Min guard;Sit to/from Nurse, children's Details (indicate cue type and reason): discussed use of built in seat for increased safety and energy conservation Functional mobility during ADLs: Min guard;Minimal assistance General ADL Comments: pt initially only requiring supervision-minguard for mobility; as pt fatigues she requires up to minA for steadying assist (room and hallway level mobility); initiated discussion on energy conservation techniques and activity progression after return home, handout issued     Vision         Perception     Praxis      Pertinent Vitals/Pain Pain Assessment: No/denies pain     Hand Dominance     Extremity/Trunk Assessment Upper Extremity Assessment Upper Extremity Assessment: Overall WFL for tasks assessed   Lower Extremity Assessment Lower Extremity Assessment: Defer to PT evaluation   Cervical / Trunk Assessment Cervical / Trunk Assessment: Normal   Communication Communication Communication: No difficulties  Cognition Arousal/Alertness: Awake/alert Behavior During Therapy: WFL for tasks assessed/performed Overall Cognitive Status: Within Functional Limits  for tasks assessed                                     General Comments  VSS on RA    Exercises     Shoulder Instructions      Home Living Family/patient expects to be discharged to:: Private residence Living Arrangements: Spouse/significant other Available Help at Discharge: Family Type of Home: House       Home Layout: Two level;Able to live on main level with bedroom/bathroom     Bathroom Shower/Tub: Walk-in shower   Bathroom Toilet: Standard(one of each)     Home Equipment: Shower seat - built in          Prior Functioning/Environment Level of Independence: Independent        Comments: just recently retired from [employer]        OT Problem List: Decreased strength;Decreased activity tolerance;Impaired balance (sitting and/or standing);Cardiopulmonary status limiting activity;Decreased knowledge of use of DME or AE      OT Treatment/Interventions: Self-care/ADL training;Therapeutic exercise;Energy conservation;DME and/or AE instruction;Therapeutic activities;Patient/family education;Balance training    OT Goals(Current goals can be found in the care plan section) Acute Rehab OT Goals Patient Stated Goal: go home with spouse OT Goal Formulation: With patient Time For Goal Achievement: 10/18/19 Potential to Achieve Goals: Good  OT Frequency: Min 3X/week   Barriers to D/C:            Co-evaluation              AM-PAC OT "6 Clicks" Daily Activity     Outcome Measure Help from another person eating meals?: None Help from another person taking care of personal grooming?: None Help from another person toileting, which includes using toliet, bedpan, or urinal?: None Help from another person bathing (including washing, rinsing, drying)?: A Little Help from another person to put on and taking off regular upper body clothing?: None Help from another person to put on and taking off regular lower body clothing?: A Little 6 Click Score:  22   End of Session Nurse Communication: Mobility status  Activity Tolerance: Patient tolerated treatment well Patient left: in bed;with call bell/phone within reach  OT Visit Diagnosis: Unsteadiness on feet (R26.81);Other (comment)(decreased activity tolerance)                Time: 1610-9604 OT Time Calculation (min): 26 min Charges:  OT General Charges $OT Visit: 1 Visit OT Evaluation $OT Eval Moderate Complexity: 1 Mod OT Treatments $Therapeutic Activity: 8-22 mins  Marcy Siren, OT [facility] Pager [phone] Office [phone]   Orlando Penner 10/04/2019, 3:52 PM

## 2019-10-04 NOTE — Progress Notes (Signed)
PROGRESS NOTE  Donna Wheeler KGM:[id number] DOB: June 27, 1961 DOA: 10/02/2019 PCP: Harlan Stains, MD   LOS: 2 days   Brief Narrative / Interim history: 58 year old female with history of hypothyroidism presented to the hospital and was admitted on 10/02/2019 with a week of GI symptoms of nausea, vomiting, diarrhea, weakness, as well as progressive cough and subjective shortness of breath.  Chest x-ray on admission showed multifocal pneumonia.  She was not hypoxic and on room air however her labs show significant elevation in her inflammatory markers  Subjective / 24h Interval events: Feeling little bit better this morning, feeling stronger.  Denies any significant shortness of breath.  Assessment & Plan: Principal Problem:   COVID-19 virus infection Active Problems:   Hypothyroid   Diarrhea   Dehydration   Hypokalemia   Principal Problem Covid-19 Viral Illness /COVID-19 pneumonia -Mainly with generalized weakness and GI symptoms, now improving.  Given multifocal infiltrates on chest x-ray she was started on Remdesivir which she is to complete for 5 days with last day on Wednesday -She had significant elevation of her inflammatory markers and was started on steroids, continue.  CRP improving however still quite high  COVID-19 Labs  Recent Labs    10/02/19 2102 10/03/19 0321 10/04/19 0454  DDIMER 0.94*  --  0.81*  FERRITIN 966* 731*  --   LDH 265*  --   --   CRP 22.6* 18.0* 16.7*    Lab Results  Component Value Date   SARSCOV2NAA POSITIVE (A) 10/02/2019   Active Problems Dehydration/hypokalemia due to COVID-19 GI illness -Hypokalemia resolved with potassium supplementation.  LFT elevation -Likely in the setting of viral illness/Remdesivir use.  Monitor  Hypothyroidism -Continue home Synthroid   Scheduled Meds: . enoxaparin (LOVENOX) injection  40 mg Subcutaneous Daily  . escitalopram  20 mg Oral Daily  . influenza vac split quadrivalent PF  0.5 mL  Intramuscular Tomorrow-1000  . levothyroxine  112 mcg Oral QAC breakfast  . methylPREDNISolone (SOLU-MEDROL) injection  60 mg Intravenous Q12H   Continuous Infusions: . remdesivir 100 mg in NS 250 mL     PRN Meds:.albuterol, guaiFENesin-dextromethorphan, [DISCONTINUED] ondansetron **OR** ondansetron (ZOFRAN) IV  DVT prophylaxis: Lovenox Code Status: Full code Family Communication: Discussed with patient as well as husband who is also hospitalized Disposition Plan: Home when ready  Consultants:  None  Procedures:  none  Microbiology: none  Antimicrobials: none   Objective: Vitals:   10/03/19 1744 10/03/19 2000 10/04/19 0300 10/04/19 0733  BP: (!) 122/109 120/68 127/74 125/72  Pulse: 80 66 63 76  Resp: 18 13 18 16   Temp: 100 F (37.8 C) 97.7 F (36.5 C) (!) 97 F (36.1 C) 97.8 F (36.6 C)  TempSrc: Oral Oral Axillary Oral  SpO2: 97% 95% 96% 96%  Weight:      Height:        Intake/Output Summary (Last 24 hours) at 10/04/2019 1035 Last data filed at 10/03/2019 2300 Gross per 24 hour  Intake 526.91 ml  Output -  Net 526.91 ml   Filed Weights   10/02/19 2015  Weight: 77.6 kg    Examination:  Constitutional: NAD Eyes: PERRL, lids and conjunctivae normal ENMT: Mucous membranes are moist.  Neck: normal, supple Respiratory: Bibasilar rhonchi, no wheezing, no crackles, normal respiratory effort without accessory muscle use Cardiovascular: Regular rate and rhythm, no murmurs / rubs / gallops. No LE edema. 2+ pedal pulses.  Abdomen: no tenderness. Bowel sounds positive.  Musculoskeletal: no clubbing / cyanosis.  Skin: no rashes Neurologic:  CN 2-12 grossly intact. Strength 5/5 in all 4.  Psychiatric: Normal judgment and insight. Alert and oriented x 3. Normal mood.    Data Reviewed: I have independently reviewed following labs and imaging studies   CBC: Recent Labs  Lab 10/02/19 2102 10/03/19 0525 10/04/19 0454  WBC 4.7 4.7 6.1  NEUTROABS 3.4  --  5.0   HGB 12.6 11.6* 11.4*  HCT 36.7 35.0* 33.8*  MCV 95.6 96.2 95.5  PLT 181 156 203   Basic Metabolic Panel: Recent Labs  Lab 10/02/19 2102 10/03/19 0318 10/04/19 0454  NA 134*  --  140  K 2.9*  --  4.3  CL 103  --  110  CO2 19*  --  21*  GLUCOSE 85  --  157*  BUN 15  --  15  CREATININE 0.79 0.78 0.59  CALCIUM 7.7*  --  8.2*  MG  --  2.4 2.2   GFR: Estimated Creatinine Clearance: 79.9 mL/min (by C-G formula based on SCr of 0.59 mg/dL). Liver Function Tests: Recent Labs  Lab 10/02/19 2102 10/04/19 0454  AST 47* 57*  ALT 37 46*  ALKPHOS 206* 191*  BILITOT 1.0 0.2*  PROT 7.2 6.4*  ALBUMIN 3.1* 2.7*   Recent Labs  Lab 10/02/19 2102  LIPASE 43   No results for input(s): AMMONIA in the last 168 hours. Coagulation Profile: No results for input(s): INR, PROTIME in the last 168 hours. Cardiac Enzymes: No results for input(s): CKTOTAL, CKMB, CKMBINDEX, TROPONINI in the last 168 hours. BNP (last 3 results) No results for input(s): PROBNP in the last 8760 hours. HbA1C: No results for input(s): HGBA1C in the last 72 hours. CBG: No results for input(s): GLUCAP in the last 168 hours. Lipid Profile: Recent Labs    10/02/19 2102  TRIG 178*   Thyroid Function Tests: No results for input(s): TSH, T4TOTAL, FREET4, T3FREE, THYROIDAB in the last 72 hours. Anemia Panel: Recent Labs    10/02/19 2102 10/03/19 0321  FERRITIN 966* 731*   Urine analysis:    Component Value Date/Time   COLORURINE YELLOW 05/10/2015 0935   APPEARANCEUR CLEAR 05/10/2015 0935   LABSPEC 1.029 05/10/2015 0935   PHURINE 5.5 05/10/2015 0935   GLUCOSEU NEG 05/10/2015 0935   HGBUR NEG 05/10/2015 0935   BILIRUBINUR NEG 05/10/2015 0935   KETONESUR NEG 05/10/2015 0935   PROTEINUR NEG 05/10/2015 0935   UROBILINOGEN 1 05/10/2015 0935   NITRITE NEG 05/10/2015 0935   LEUKOCYTESUR NEG 05/10/2015 0935   Sepsis Labs: Invalid input(s): PROCALCITONIN, LACTICIDVEN  Recent Results (from the past 240  hour(s))  Blood Culture (routine x 2)     Status: None (Preliminary result)   Collection Time: 10/02/19  9:02 PM   Specimen: BLOOD RIGHT FOREARM  Result Value Ref Range Status   Specimen Description   Final    BLOOD RIGHT FOREARM Performed at [facility], [address] [place], [place] [address]    Special Requests   Final    BOTTLES DRAWN AEROBIC AND ANAEROBIC Blood Culture adequate volume Performed at [facility], [address], [place], [place] [address]    Culture   Final    NO GROWTH 2 DAYS Performed at [facility], [address], [place], [place] [address]    Report Status PENDING  Incomplete  Blood Culture (routine x 2)     Status: None (Preliminary result)   Collection Time: 10/02/19  9:02 PM   Specimen: BLOOD  Result Value Ref  Range Status   Specimen Description   Final    BLOOD LEFT WRIST Performed at [facility], [address] [place], [place] [address]    Special Requests   Final    BOTTLES DRAWN AEROBIC AND ANAEROBIC Blood Culture adequate volume Performed at [facility], [address] [place], [place] [address]    Culture   Final    NO GROWTH 2 DAYS Performed at [facility], [address] [place], [place] [address]    Report Status PENDING  Incomplete  SARS CORONAVIRUS 2 (TAT 6-24 HRS) Nasopharyngeal Nasopharyngeal Swab     Status: Abnormal   Collection Time: 10/02/19  9:02 PM   Specimen: Nasopharyngeal Swab  Result Value Ref Range Status   SARS Coronavirus 2 POSITIVE (A) NEGATIVE Final    Comment: RESULT CALLED TO, READ BACK BY AND VERIFIED WITH: Lulamae CLAPP RN.@[email or web address]  ON 10.17.2020 BY TCALDWELL MT. (NOTE) SARS-CoV-2 target nucleic acids are DETECTED. The SARS-CoV-2 RNA is generally detectable in upper and lower respiratory specimens during the acute phase of infection. Positive results are indicative of active infection with SARS-CoV-2. Clinical   correlation with patient history and other diagnostic information is necessary to determine patient infection status. Positive results do  not rule out bacterial infection or co-infection with other viruses. The expected result is Negative. Fact Sheet for Patients: HairSlick.nohttps://www.fda.gov/media/138098/download Fact Sheet for Healthcare Providers: quierodirigir.comhttps://www.fda.gov/media/138095/download This test is not yet approved or cleared by the [place] FDA and  has been authorized for detection and/or diagnosis of SARS-CoV-2 by FDA under an Emergency Use Authorization (EUA). This EUA will remain  in effect (meaning this test ca n be used) for the duration of the COVID-19 declaration under Section 564(b)(1) of the Act, 21 U.S.C. section 360bbb-3(b)(1), unless the authorization is terminated or revoked sooner. Performed at [facility], [address] [place], [place] [address]       Radiology Studies: Dg Chest Port 1 View  Result Date: 10/02/2019 CLINICAL DATA:  Cough, shortness of breath, covered positive EXAM: PORTABLE CHEST 1 VIEW COMPARISON:  None. FINDINGS: Multifocal areas of interstitial and airspace opacity throughout both lungs most pronounced in the right lung periphery. No pneumothorax or effusion. The cardiomediastinal contours are unremarkable. Degenerative changes are present in the imaged spine and shoulders. No acute osseous or soft tissue abnormality. Coil projects over the left mid lung, likely fiducial marker. IMPRESSION: Multifocal areas of interstitial and airspace opacity throughout both lungs, most pronounced in the right lung periphery, compatible with multifocal pneumonia including COVID-19 etiology. Electronically Signed   By: Kreg ShropshirePrice  DeHay M.D.   On: 10/02/2019 20:38   Dg Abd Portable 1 View  Result Date: 10/02/2019 CLINICAL DATA:  Diarrhea EXAM: PORTABLE ABDOMEN - 1 VIEW COMPARISON:  None. FINDINGS: The bowel gas pattern is normal. No radio-opaque calculi or  other significant radiographic abnormality are seen. IMPRESSION: Negative. Electronically Signed   By: Duanne GuessNicholas  Plundo M.D.   On: 10/02/2019 20:38   Pamella Pertostin Gherghe, MD, PhD [facility]  Contact via  [email or web address]  [facility] Office Info P: [phone] F: [phone]

## 2019-10-05 LAB — COMPREHENSIVE METABOLIC PANEL
ALT: 47 U/L — ABNORMAL HIGH (ref 0–44)
AST: 47 U/L — ABNORMAL HIGH (ref 15–41)
Albumin: 2.8 g/dL — ABNORMAL LOW (ref 3.5–5.0)
Alkaline Phosphatase: 179 U/L — ABNORMAL HIGH (ref 38–126)
Anion gap: 10 (ref 5–15)
BUN: 19 mg/dL (ref 6–20)
CO2: 22 mmol/L (ref 22–32)
Calcium: 8.1 mg/dL — ABNORMAL LOW (ref 8.9–10.3)
Chloride: 108 mmol/L (ref 98–111)
Creatinine, Ser: 0.56 mg/dL (ref 0.44–1.00)
GFR calc Af Amer: >60 mL/min (ref >60)
GFR calc non Af Amer: >60 mL/min (ref >60)
Glucose, Bld: 166 mg/dL — ABNORMAL HIGH (ref 70–99)
Potassium: 3.8 mmol/L (ref 3.5–5.1)
Sodium: 140 mmol/L (ref 135–145)
Total Bilirubin: 0.5 mg/dL (ref 0.3–1.2)
Total Protein: 6.3 g/dL — ABNORMAL LOW (ref 6.5–8.1)

## 2019-10-05 LAB — C-REACTIVE PROTEIN: CRP: 7.7 mg/dL — ABNORMAL HIGH (ref <1.0)

## 2019-10-05 LAB — CBC WITH DIFFERENTIAL/PLATELET
Abs Immature Granulocytes: 0.06 10*3/uL (ref 0.00–0.07)
Basophils Absolute: 0 10*3/uL (ref 0.0–0.1)
Basophils Relative: 0 %
Eosinophils Absolute: 0 10*3/uL (ref 0.0–0.5)
Eosinophils Relative: 0 %
HCT: 33.1 % — ABNORMAL LOW (ref 36.0–46.0)
Hemoglobin: 11.1 g/dL — ABNORMAL LOW (ref 12.0–15.0)
Immature Granulocytes: 1 %
Lymphocytes Relative: 10 %
Lymphs Abs: 0.9 10*3/uL (ref 0.7–4.0)
MCH: 32 pg (ref 26.0–34.0)
MCHC: 33.5 g/dL (ref 30.0–36.0)
MCV: 95.4 fL (ref 80.0–100.0)
Monocytes Absolute: 0.3 10*3/uL (ref 0.1–1.0)
Monocytes Relative: 3 %
Neutro Abs: 8.2 10*3/uL — ABNORMAL HIGH (ref 1.7–7.7)
Neutrophils Relative %: 86 %
Platelets: 246 10*3/uL (ref 150–400)
RBC: 3.47 MIL/uL — ABNORMAL LOW (ref 3.87–5.11)
RDW: 12.4 % (ref 11.5–15.5)
WBC: 9.5 10*3/uL (ref 4.0–10.5)
nRBC: 0 % (ref 0.0–0.2)

## 2019-10-05 LAB — D-DIMER, QUANTITATIVE: D-Dimer, Quant: 0.56 ug/mL-FEU — ABNORMAL HIGH (ref 0.00–0.50)

## 2019-10-05 LAB — BRAIN NATRIURETIC PEPTIDE: B Natriuretic Peptide: 337.8 pg/mL — ABNORMAL HIGH (ref 0.0–100.0)

## 2019-10-05 LAB — MAGNESIUM: Magnesium: 2.2 mg/dL (ref 1.7–2.4)

## 2019-10-05 MED ORDER — ZOLPIDEM TARTRATE 5 MG PO TABS
5.0000 mg | ORAL_TABLET | Freq: Every evening | ORAL | Status: DC | PRN
  Administered 2019-10-05 – 2019-10-06 (×2): 5 mg via ORAL
  Filled 2019-10-05 (×2): qty 1

## 2019-10-05 NOTE — Progress Notes (Signed)
PROGRESS NOTE  Donna Wheeler WFU:[id number] DOB: 1961-01-12 DOA: 10/02/2019 PCP: Harlan Stains, MD   LOS: 3 days   Brief Narrative / Interim history: 58 year old female with history of hypothyroidism presented to the hospital and was admitted on 10/02/2019 with a week of GI symptoms of nausea, vomiting, diarrhea, weakness, as well as progressive cough and subjective shortness of breath.  Chest x-ray on admission showed multifocal pneumonia.  She was not hypoxic and on room air however her labs show significant elevation in her inflammatory markers  Subjective / 24h Interval events: Still weak but better. Denies dyspnea this morning, no chest pain, no abdominal pain, nausea/vomiting  Assessment & Plan: Principal Problem:   COVID-19 virus infection Active Problems:   Hypothyroid   Diarrhea   Dehydration   Hypokalemia   Principal Problem Covid-19 Viral Illness /COVID-19 pneumonia -Mainly with generalized weakness and GI symptoms, now improving.  Given multifocal infiltrates on chest x-ray she was started on Remdesivir which she is to complete for 5 days with last day on Wednesday -s/p plasma 10/18 -improving  COVID-19 Labs  Recent Labs    10/02/19 2102 10/03/19 0321 10/04/19 0454 10/05/19 0420  DDIMER 0.94*  --  0.81* 0.56*  FERRITIN 966* 731*  --   --   LDH 265*  --   --   --   CRP 22.6* 18.0* 16.7* 7.7*    Lab Results  Component Value Date   SARSCOV2NAA POSITIVE (A) 10/02/2019   Active Problems Dehydration/hypokalemia due to COVID-19 GI illness -Hypokalemia resolved with potassium supplementation.  LFT elevation -Likely in the setting of viral illness/Remdesivir use.  Monitor.  Stable today  Hypothyroidism -Continue home Synthroid   Scheduled Meds: . enoxaparin (LOVENOX) injection  40 mg Subcutaneous Daily  . escitalopram  20 mg Oral Daily  . [START ON 10/08/2019] influenza vac split quadrivalent PF  0.5 mL Intramuscular Tomorrow-1000  . levothyroxine   112 mcg Oral QAC breakfast  . methylPREDNISolone (SOLU-MEDROL) injection  60 mg Intravenous Q12H   Continuous Infusions: . remdesivir 100 mg in NS 250 mL 100 mg (10/04/19 1039)   PRN Meds:.albuterol, guaiFENesin-dextromethorphan, [DISCONTINUED] ondansetron **OR** ondansetron (ZOFRAN) IV  DVT prophylaxis: Lovenox Code Status: Full code Family Communication: Discussed with patient as well as husband who is also hospitalized Disposition Plan: Home when ready  Consultants:  None  Procedures:  none  Microbiology: none  Antimicrobials: none   Objective: Vitals:   10/05/19 0230 10/05/19 0300 10/05/19 0548 10/05/19 0745  BP: 123/68  134/64 134/81  Pulse: (!) 57 (!) 56 (!) 58   Resp:   16 12  Temp: 97.7 F (36.5 C) 97.7 F (36.5 C) 97.8 F (36.6 C) 97.7 F (36.5 C)  TempSrc: Oral Oral Oral Oral  SpO2: 96% 96% 95% 98%  Weight:      Height:        Intake/Output Summary (Last 24 hours) at 10/05/2019 0943 Last data filed at 10/05/2019 0548 Gross per 24 hour  Intake 747 ml  Output -  Net 747 ml   Filed Weights   10/02/19 2015  Weight: 77.6 kg    Examination:  Constitutional: No distress, sitting in chair, eating breakfast Eyes: No scleral icterus ENMT: mmm Neck: normal, supple Respiratory: No wheezing, no crackles, diminished at the bases Cardiovascular: Regular rate and rhythm, no murmurs, no edema Abdomen: Soft, bowel sounds positive Musculoskeletal: no clubbing / cyanosis.  Skin: No rashes seen Neurologic: Nonfocal, equal strength Psychiatric: Normal judgment and insight. Alert and oriented x 3.  Normal mood.    Data Reviewed: I have independently reviewed following labs and imaging studies   CBC: Recent Labs  Lab 10/02/19 2102 10/03/19 0525 10/04/19 0454 10/05/19 0420  WBC 4.7 4.7 6.1 9.5  NEUTROABS 3.4  --  5.0 8.2*  HGB 12.6 11.6* 11.4* 11.1*  HCT 36.7 35.0* 33.8* 33.1*  MCV 95.6 96.2 95.5 95.4  PLT 181 156 203 246   Basic Metabolic Panel:  Recent Labs  Lab 10/02/19 2102 10/03/19 0318 10/04/19 0454 10/05/19 0420  NA 134*  --  140 140  K 2.9*  --  4.3 3.8  CL 103  --  110 108  CO2 19*  --  21* 22  GLUCOSE 85  --  157* 166*  BUN 15  --  15 19  CREATININE 0.79 0.78 0.59 0.56  CALCIUM 7.7*  --  8.2* 8.1*  MG  --  2.4 2.2 2.2   GFR: Estimated Creatinine Clearance: 79.9 mL/min (by C-G formula based on SCr of 0.56 mg/dL). Liver Function Tests: Recent Labs  Lab 10/02/19 2102 10/04/19 0454 10/05/19 0420  AST 47* 57* 47*  ALT 37 46* 47*  ALKPHOS 206* 191* 179*  BILITOT 1.0 0.2* 0.5  PROT 7.2 6.4* 6.3*  ALBUMIN 3.1* 2.7* 2.8*   Recent Labs  Lab 10/02/19 2102  LIPASE 43   No results for input(s): AMMONIA in the last 168 hours. Coagulation Profile: No results for input(s): INR, PROTIME in the last 168 hours. Cardiac Enzymes: No results for input(s): CKTOTAL, CKMB, CKMBINDEX, TROPONINI in the last 168 hours. BNP (last 3 results) No results for input(s): PROBNP in the last 8760 hours. HbA1C: No results for input(s): HGBA1C in the last 72 hours. CBG: No results for input(s): GLUCAP in the last 168 hours. Lipid Profile: Recent Labs    10/02/19 2102  TRIG 178*   Thyroid Function Tests: No results for input(s): TSH, T4TOTAL, FREET4, T3FREE, THYROIDAB in the last 72 hours. Anemia Panel: Recent Labs    10/02/19 2102 10/03/19 0321  FERRITIN 966* 731*   Urine analysis:    Component Value Date/Time   COLORURINE YELLOW 05/10/2015 0935   APPEARANCEUR CLEAR 05/10/2015 0935   LABSPEC 1.029 05/10/2015 0935   PHURINE 5.5 05/10/2015 0935   GLUCOSEU NEG 05/10/2015 0935   HGBUR NEG 05/10/2015 0935   BILIRUBINUR NEG 05/10/2015 0935   KETONESUR NEG 05/10/2015 0935   PROTEINUR NEG 05/10/2015 0935   UROBILINOGEN 1 05/10/2015 0935   NITRITE NEG 05/10/2015 0935   LEUKOCYTESUR NEG 05/10/2015 0935   Sepsis Labs: Invalid input(s): PROCALCITONIN, LACTICIDVEN  Recent Results (from the past 240 hour(s))  Blood  Culture (routine x 2)     Status: None (Preliminary result)   Collection Time: 10/02/19  9:02 PM   Specimen: BLOOD RIGHT FOREARM  Result Value Ref Range Status   Specimen Description   Final    BLOOD RIGHT FOREARM Performed at [facility], [address] [place], [place] [address]    Special Requests   Final    BOTTLES DRAWN AEROBIC AND ANAEROBIC Blood Culture adequate volume Performed at [facility], [address] [place], [place] [address]    Culture   Final    NO GROWTH 3 DAYS Performed at [facility], [address] [place], [place] [address]    Report Status PENDING  Incomplete  Blood Culture (routine x 2)     Status: None (Preliminary result)   Collection Time: 10/02/19  9:02 PM  Specimen: BLOOD  Result Value Ref Range Status   Specimen Description   Final    BLOOD LEFT WRIST Performed at [facility], [address], [place], [place] [address]    Special Requests   Final    BOTTLES DRAWN AEROBIC AND ANAEROBIC Blood Culture adequate volume Performed at [facility], [address], [place], [place] [address]    Culture   Final    NO GROWTH 3 DAYS Performed at [facility], [address] [place], [place] [address]    Report Status PENDING  Incomplete  SARS CORONAVIRUS 2 (TAT 6-24 HRS) Nasopharyngeal Nasopharyngeal Swab     Status: Abnormal   Collection Time: 10/02/19  9:02 PM   Specimen: Nasopharyngeal Swab  Result Value Ref Range Status   SARS Coronavirus 2 POSITIVE (A) NEGATIVE Final    Comment: RESULT CALLED TO, READ BACK BY AND VERIFIED WITH: Aryanah CLAPP RN.@[email or web address]  ON 10.17.2020 BY TCALDWELL MT. (NOTE) SARS-CoV-2 target nucleic acids are DETECTED. The SARS-CoV-2 RNA is generally detectable in upper and lower respiratory specimens during the acute phase of infection. Positive results are indicative of active infection with SARS-CoV-2. Clinical  correlation with patient  history and other diagnostic information is necessary to determine patient infection status. Positive results do  not rule out bacterial infection or co-infection with other viruses. The expected result is Negative. Fact Sheet for Patients: 10.19.2020 Fact Sheet for Healthcare Providers: HairSlick.no This test is not yet approved or cleared by the quierodirigir.com FDA and  has been authorized for detection and/or diagnosis of SARS-CoV-2 by FDA under an Emergency Use Authorization (EUA). This EUA will remain  in effect (meaning this test ca n be used) for the duration of the COVID-19 declaration under Section 564(b)(1) of the Act, 21 U.S.C. section 360bbb-3(b)(1), unless the authorization is terminated or revoked sooner. Performed at [facility], [address], [place], [place]       Radiology Studies: No results found. [id number], MD, PhD [facility]  Contact via  [email or web address]  [facility] Office Info P: [phone] F: [phone]

## 2019-10-05 NOTE — Progress Notes (Signed)
Physical Therapy Treatment Patient Details Name: Donna Wheeler MRN: [id number] DOB: 1961-04-14 Today's Date: 10/05/2019    History of Present Illness 58 y/o female w/ hx of anxiety, depression, hypothyroid,  dx with COVID approx 10/10 but was sent home. continued to have n/v/d/ and coughing until hospital admit 10/16. Spouse is also hospitalized with COVID at this hospital.    PT Comments    The patient is independent, ambulated x 250' on RA, SPO2 955. No further PT needs at this time. Plans to DC 10/21. Patient able to ambulate w/ nursing in hall. PT will sign off.sign   Follow Up Recommendations  No PT follow up     Equipment Recommendations  None recommended by PT    Recommendations for Other Services       Precautions / Restrictions Precautions Precautions: None    Mobility  Bed Mobility Overal bed mobility: Independent                Transfers Overall transfer level: Independent                  Ambulation/Gait Ambulation/Gait assistance: Independent Gait Distance (Feet): 400 Feet Assistive device: None Gait Pattern/deviations: WFL(Within Functional Limits)         Stairs             Wheelchair Mobility    Modified Rankin (Stroke Patients Only)       Balance                                            Cognition Arousal/Alertness: Awake/alert                                            Exercises      General Comments        Pertinent Vitals/Pain      Home Living                      Prior Function            PT Goals (current goals can now be found in the care plan section) Progress towards PT goals: Progressing toward goals    Frequency    Min 3X/week      PT Plan Discharge plan needs to be updated    Co-evaluation              AM-PAC PT "6 Clicks" Mobility   Outcome Measure  Help needed turning from your back to your side while in a flat bed  without using bedrails?: None Help needed moving from lying on your back to sitting on the side of a flat bed without using bedrails?: None Help needed moving to and from a bed to a chair (including a wheelchair)?: None Help needed standing up from a chair using your arms (e.g., wheelchair or bedside chair)?: None Help needed to walk in hospital room?: None Help needed climbing 3-5 steps with a railing? : A Little 6 Click Score: 23    End of Session   Activity Tolerance: Patient tolerated treatment well Patient left: in bed;with call bell/phone within reach Nurse Communication: Mobility status PT Visit Diagnosis: Difficulty in walking, not elsewhere classified (R26.2)     Time: 7425-9563 PT Time  Calculation (min) (ACUTE ONLY): 15 min  Charges:  $Gait Training: 8-22 mins                     Blanchard Kelch PT Acute Rehabilitation Services Pager [phone] Office [phone]    Rada Hay 10/05/2019, 5:28 PM

## 2019-10-05 NOTE — Progress Notes (Signed)
Patient received plasma during this shift. Tolerated Well. Patient also had PIV infiltrate on NS. Warm compress applied to site after removal and new PIV obtained.

## 2019-10-06 LAB — COMPREHENSIVE METABOLIC PANEL
ALT: 61 U/L — ABNORMAL HIGH (ref 0–44)
AST: 46 U/L — ABNORMAL HIGH (ref 15–41)
Albumin: 2.9 g/dL — ABNORMAL LOW (ref 3.5–5.0)
Alkaline Phosphatase: 176 U/L — ABNORMAL HIGH (ref 38–126)
Anion gap: 11 (ref 5–15)
BUN: 16 mg/dL (ref 6–20)
CO2: 24 mmol/L (ref 22–32)
Calcium: 8.1 mg/dL — ABNORMAL LOW (ref 8.9–10.3)
Chloride: 106 mmol/L (ref 98–111)
Creatinine, Ser: 0.62 mg/dL (ref 0.44–1.00)
GFR calc Af Amer: >60 mL/min (ref >60)
GFR calc non Af Amer: >60 mL/min (ref >60)
Glucose, Bld: 155 mg/dL — ABNORMAL HIGH (ref 70–99)
Potassium: 3.9 mmol/L (ref 3.5–5.1)
Sodium: 141 mmol/L (ref 135–145)
Total Bilirubin: 0.7 mg/dL (ref 0.3–1.2)
Total Protein: 6.5 g/dL (ref 6.5–8.1)

## 2019-10-06 LAB — CBC WITH DIFFERENTIAL/PLATELET
Abs Immature Granulocytes: 0.1 10*3/uL — ABNORMAL HIGH (ref 0.00–0.07)
Basophils Absolute: 0 10*3/uL (ref 0.0–0.1)
Basophils Relative: 0 %
Eosinophils Absolute: 0 10*3/uL (ref 0.0–0.5)
Eosinophils Relative: 0 %
HCT: 33.8 % — ABNORMAL LOW (ref 36.0–46.0)
Hemoglobin: 11.3 g/dL — ABNORMAL LOW (ref 12.0–15.0)
Immature Granulocytes: 1 %
Lymphocytes Relative: 11 %
Lymphs Abs: 0.8 10*3/uL (ref 0.7–4.0)
MCH: 32.1 pg (ref 26.0–34.0)
MCHC: 33.4 g/dL (ref 30.0–36.0)
MCV: 96 fL (ref 80.0–100.0)
Monocytes Absolute: 0.3 10*3/uL (ref 0.1–1.0)
Monocytes Relative: 4 %
Neutro Abs: 6.6 10*3/uL (ref 1.7–7.7)
Neutrophils Relative %: 84 %
Platelets: 255 10*3/uL (ref 150–400)
RBC: 3.52 MIL/uL — ABNORMAL LOW (ref 3.87–5.11)
RDW: 12.4 % (ref 11.5–15.5)
WBC: 7.9 10*3/uL (ref 4.0–10.5)
nRBC: 0 % (ref 0.0–0.2)

## 2019-10-06 LAB — GLUCOSE, CAPILLARY: Glucose-Capillary: 150 mg/dL — ABNORMAL HIGH (ref 70–99)

## 2019-10-06 LAB — D-DIMER, QUANTITATIVE: D-Dimer, Quant: 0.56 ug/mL-FEU — ABNORMAL HIGH (ref 0.00–0.50)

## 2019-10-06 LAB — BPAM FFP
Blood Product Expiration Date: 202010192322
ISSUE DATE / TIME: 202010190029
Unit Type and Rh: 6200

## 2019-10-06 LAB — BRAIN NATRIURETIC PEPTIDE: B Natriuretic Peptide: 503.7 pg/mL — ABNORMAL HIGH (ref 0.0–100.0)

## 2019-10-06 LAB — PREPARE FRESH FROZEN PLASMA: Unit division: 0

## 2019-10-06 LAB — MAGNESIUM: Magnesium: 2.3 mg/dL (ref 1.7–2.4)

## 2019-10-06 LAB — C-REACTIVE PROTEIN: CRP: 4.3 mg/dL — ABNORMAL HIGH (ref <1.0)

## 2019-10-06 NOTE — Progress Notes (Signed)
PROGRESS NOTE  Donna Wheeler RCV:[id number] DOB: Dec 10, 1961 DOA: 10/02/2019 PCP: Harlan Stains, MD   LOS: 4 days   Brief Narrative / Interim history: 58 year old female with history of hypothyroidism presented to the hospital and was admitted on 10/02/2019 with a week of GI symptoms of nausea, vomiting, diarrhea, weakness, as well as progressive cough and subjective shortness of breath.  Chest x-ray on admission showed multifocal pneumonia.  She was not hypoxic and on room air however her labs show significant elevation in her inflammatory markers  Subjective / 24h Interval events: Improving, feeling almost back to baseline.  No shortness of breath.  Assessment & Plan: Principal Problem:   COVID-19 virus infection Active Problems:   Hypothyroid   Diarrhea   Dehydration   Hypokalemia   Principal Problem Covid-19 Viral Illness /COVID-19 pneumonia -Mainly with generalized weakness and GI symptoms, now improving.  Given multifocal infiltrates on chest x-ray she was started on Remdesivir which she is to complete for 5 days with last day on Wednesday, anticipate home discharge tomorrow after last dose -s/p plasma 10/18  COVID-19 Labs  Recent Labs    10/04/19 0454 10/05/19 0420 10/06/19 0410  DDIMER 0.81* 0.56* 0.56*  CRP 16.7* 7.7* 4.3*    Lab Results  Component Value Date   SARSCOV2NAA POSITIVE (A) 10/02/2019   Active Problems Dehydration/hypokalemia due to COVID-19 GI illness -Hypokalemia resolved with potassium supplementation.  LFT elevation -Likely in the setting of viral illness/Remdesivir use.  Monitor.  Stable today  Hypothyroidism -Continue home Synthroid   Scheduled Meds: . enoxaparin (LOVENOX) injection  40 mg Subcutaneous Daily  . escitalopram  20 mg Oral Daily  . [START ON 10/08/2019] influenza vac split quadrivalent PF  0.5 mL Intramuscular Tomorrow-1000  . levothyroxine  112 mcg Oral QAC breakfast  . methylPREDNISolone (SOLU-MEDROL) injection  60  mg Intravenous Q12H   Continuous Infusions: . remdesivir 100 mg in NS 250 mL 100 mg (10/06/19 1117)   PRN Meds:.albuterol, guaiFENesin-dextromethorphan, [DISCONTINUED] ondansetron **OR** ondansetron (ZOFRAN) IV, zolpidem  DVT prophylaxis: Lovenox Code Status: Full code Family Communication: Discussed with patient Disposition Plan: Home when ready  Consultants:  None  Procedures:  none  Microbiology: none  Antimicrobials: none   Objective: Vitals:   10/05/19 2306 10/06/19 0026 10/06/19 0356 10/06/19 0746  BP: 135/67 (!) 123/95 (!) 129/96 134/81  Pulse:  (!) 52 (!) 49 66  Resp: 20 (!) 21 17 20   Temp: 98 F (36.7 C) 98 F (36.7 C) 98.5 F (36.9 C) 98.2 F (36.8 C)  TempSrc: Oral Oral Oral Oral  SpO2: 96% 96% 94% 95%  Weight:      Height:       No intake or output data in the 24 hours ending 10/06/19 1117 Filed Weights   10/02/19 2015  Weight: 77.6 kg    Examination:  Constitutional: NAD Respiratory: CTA, no wheezing Cardiovascular: Regular rate and rhythm, no murmurs  Data Reviewed: I have independently reviewed following labs and imaging studies   CBC: Recent Labs  Lab 10/02/19 2102 10/03/19 0525 10/04/19 0454 10/05/19 0420 10/06/19 0410  WBC 4.7 4.7 6.1 9.5 7.9  NEUTROABS 3.4  --  5.0 8.2* 6.6  HGB 12.6 11.6* 11.4* 11.1* 11.3*  HCT 36.7 35.0* 33.8* 33.1* 33.8*  MCV 95.6 96.2 95.5 95.4 96.0  PLT 181 156 203 246 102   Basic Metabolic Panel: Recent Labs  Lab 10/02/19 2102 10/03/19 0318 10/04/19 0454 10/05/19 0420 10/06/19 0410  NA 134*  --  140 140 141  K 2.9*  --  4.3 3.8 3.9  CL 103  --  110 108 106  CO2 19*  --  21* 22 24  GLUCOSE 85  --  157* 166* 155*  BUN 15  --  15 19 16   CREATININE 0.79 0.78 0.59 0.56 0.62  CALCIUM 7.7*  --  8.2* 8.1* 8.1*  MG  --  2.4 2.2 2.2 2.3   GFR: Estimated Creatinine Clearance: 79.9 mL/min (by C-G formula based on SCr of 0.62 mg/dL). Liver Function Tests: Recent Labs  Lab 10/02/19 2102 10/04/19  0454 10/05/19 0420 10/06/19 0410  AST 47* 57* 47* 46*  ALT 37 46* 47* 61*  ALKPHOS 206* 191* 179* 176*  BILITOT 1.0 0.2* 0.5 0.7  PROT 7.2 6.4* 6.3* 6.5  ALBUMIN 3.1* 2.7* 2.8* 2.9*   Recent Labs  Lab 10/02/19 2102  LIPASE 43   No results for input(s): AMMONIA in the last 168 hours. Coagulation Profile: No results for input(s): INR, PROTIME in the last 168 hours. Cardiac Enzymes: No results for input(s): CKTOTAL, CKMB, CKMBINDEX, TROPONINI in the last 168 hours. BNP (last 3 results) No results for input(s): PROBNP in the last 8760 hours. HbA1C: No results for input(s): HGBA1C in the last 72 hours. CBG: Recent Labs  Lab 10/06/19 0827  GLUCAP 150*   Lipid Profile: No results for input(s): CHOL, HDL, LDLCALC, TRIG, CHOLHDL, LDLDIRECT in the last 72 hours. Thyroid Function Tests: No results for input(s): TSH, T4TOTAL, FREET4, T3FREE, THYROIDAB in the last 72 hours. Anemia Panel: No results for input(s): VITAMINB12, FOLATE, FERRITIN, TIBC, IRON, RETICCTPCT in the last 72 hours. Urine analysis:    Component Value Date/Time   COLORURINE YELLOW 05/10/2015 0935   APPEARANCEUR CLEAR 05/10/2015 0935   LABSPEC 1.029 05/10/2015 0935   PHURINE 5.5 05/10/2015 0935   GLUCOSEU NEG 05/10/2015 0935   HGBUR NEG 05/10/2015 0935   BILIRUBINUR NEG 05/10/2015 0935   KETONESUR NEG 05/10/2015 0935   PROTEINUR NEG 05/10/2015 0935   UROBILINOGEN 1 05/10/2015 0935   NITRITE NEG 05/10/2015 0935   LEUKOCYTESUR NEG 05/10/2015 0935   Sepsis Labs: Invalid input(s): PROCALCITONIN, LACTICIDVEN  Recent Results (from the past 240 hour(s))  Blood Culture (routine x 2)     Status: None (Preliminary result)   Collection Time: 10/02/19  9:02 PM   Specimen: BLOOD RIGHT FOREARM  Result Value Ref Range Status   Specimen Description   Final    BLOOD RIGHT FOREARM Performed at [facility], [address] [place], [place] [address]    Special Requests   Final    BOTTLES DRAWN  AEROBIC AND ANAEROBIC Blood Culture adequate volume Performed at [facility], [address] [place], [place] [address]    Culture   Final    NO GROWTH 4 DAYS Performed at [facility], [address] [place], [place] [address]    Report Status PENDING  Incomplete  Blood Culture (routine x 2)     Status: None (Preliminary result)   Collection Time: 10/02/19  9:02 PM   Specimen: BLOOD  Result Value Ref Range Status   Specimen Description   Final    BLOOD LEFT WRIST Performed at [facility], [address] [place], [place] [address]    Special Requests   Final    BOTTLES DRAWN AEROBIC AND ANAEROBIC Blood Culture adequate volume Performed at [facility], [address] [place], [place] [address]    Culture   Final  NO GROWTH 4 DAYS Performed at [facility], [address] [place], [place] [address]    Report Status PENDING  Incomplete  SARS CORONAVIRUS 2 (TAT 6-24 HRS) Nasopharyngeal Nasopharyngeal Swab     Status: Abnormal   Collection Time: 10/02/19  9:02 PM   Specimen: Nasopharyngeal Swab  Result Value Ref Range Status   SARS Coronavirus 2 POSITIVE (A) NEGATIVE Final    Comment: RESULT CALLED TO, READ BACK BY AND VERIFIED WITH: Andrea CLAPP RN.@[email or web address]  ON 10.17.2020 BY TCALDWELL MT. (NOTE) SARS-CoV-2 target nucleic acids are DETECTED. The SARS-CoV-2 RNA is generally detectable in upper and lower respiratory specimens during the acute phase of infection. Positive results are indicative of active infection with SARS-CoV-2. Clinical  correlation with patient history and other diagnostic information is necessary to determine patient infection status. Positive results do  not rule out bacterial infection or co-infection with other viruses. The expected result is Negative. Fact Sheet for Patients: 10.19.2020 Fact Sheet for Healthcare Providers:  HairSlick.no This test is not yet approved or cleared by the quierodirigir.com FDA and  has been authorized for detection and/or diagnosis of SARS-CoV-2 by FDA under an Emergency Use Authorization (EUA). This EUA will remain  in effect (meaning this test ca n be used) for the duration of the COVID-19 declaration under Section 564(b)(1) of the Act, 21 U.S.C. section 360bbb-3(b)(1), unless the authorization is terminated or revoked sooner. Performed at [facility], [address], [place], [place]       Radiology Studies: No results found. [id number], MD, PhD [facility]  Contact via  [email or web address]  [facility] Office Info P: [phone] F: [phone]

## 2019-10-06 NOTE — Progress Notes (Signed)
Patient declined this RN to call family with updates

## 2019-10-07 LAB — COMPREHENSIVE METABOLIC PANEL
ALT: 61 U/L — ABNORMAL HIGH (ref 0–44)
AST: 35 U/L (ref 15–41)
Albumin: 3 g/dL — ABNORMAL LOW (ref 3.5–5.0)
Alkaline Phosphatase: 161 U/L — ABNORMAL HIGH (ref 38–126)
Anion gap: 11 (ref 5–15)
BUN: 18 mg/dL (ref 6–20)
CO2: 24 mmol/L (ref 22–32)
Calcium: 7.8 mg/dL — ABNORMAL LOW (ref 8.9–10.3)
Chloride: 101 mmol/L (ref 98–111)
Creatinine, Ser: 0.6 mg/dL (ref 0.44–1.00)
GFR calc Af Amer: >60 mL/min (ref >60)
GFR calc non Af Amer: >60 mL/min (ref >60)
Glucose, Bld: 157 mg/dL — ABNORMAL HIGH (ref 70–99)
Potassium: 3.7 mmol/L (ref 3.5–5.1)
Sodium: 136 mmol/L (ref 135–145)
Total Bilirubin: 1 mg/dL (ref 0.3–1.2)
Total Protein: 6.5 g/dL (ref 6.5–8.1)

## 2019-10-07 LAB — CULTURE, BLOOD (ROUTINE X 2)
Culture: NO GROWTH
Culture: NO GROWTH
Special Requests: ADEQUATE
Special Requests: ADEQUATE

## 2019-10-07 LAB — CBC WITH DIFFERENTIAL/PLATELET
Abs Immature Granulocytes: 0.18 10*3/uL — ABNORMAL HIGH (ref 0.00–0.07)
Basophils Absolute: 0 10*3/uL (ref 0.0–0.1)
Basophils Relative: 0 %
Eosinophils Absolute: 0 10*3/uL (ref 0.0–0.5)
Eosinophils Relative: 0 %
HCT: 33.6 % — ABNORMAL LOW (ref 36.0–46.0)
Hemoglobin: 11.6 g/dL — ABNORMAL LOW (ref 12.0–15.0)
Immature Granulocytes: 2 %
Lymphocytes Relative: 11 %
Lymphs Abs: 0.9 10*3/uL (ref 0.7–4.0)
MCH: 32.5 pg (ref 26.0–34.0)
MCHC: 34.5 g/dL (ref 30.0–36.0)
MCV: 94.1 fL (ref 80.0–100.0)
Monocytes Absolute: 0.4 10*3/uL (ref 0.1–1.0)
Monocytes Relative: 5 %
Neutro Abs: 7 10*3/uL (ref 1.7–7.7)
Neutrophils Relative %: 82 %
Platelets: 313 10*3/uL (ref 150–400)
RBC: 3.57 MIL/uL — ABNORMAL LOW (ref 3.87–5.11)
RDW: 12 % (ref 11.5–15.5)
WBC: 8.6 10*3/uL (ref 4.0–10.5)
nRBC: 0 % (ref 0.0–0.2)

## 2019-10-07 LAB — D-DIMER, QUANTITATIVE: D-Dimer, Quant: 0.68 ug/mL-FEU — ABNORMAL HIGH (ref 0.00–0.50)

## 2019-10-07 LAB — C-REACTIVE PROTEIN: CRP: 2.3 mg/dL — ABNORMAL HIGH (ref <1.0)

## 2019-10-07 MED ORDER — INFLUENZA VAC SPLIT QUAD 0.5 ML IM SUSY
0.5000 mL | PREFILLED_SYRINGE | INTRAMUSCULAR | Status: AC
  Administered 2019-10-07: 0.5 mL via INTRAMUSCULAR
  Filled 2019-10-07: qty 0.5

## 2019-10-07 MED ORDER — PREDNISONE 10 MG (21) PO TBPK: ORAL_TABLET | ORAL | 0 refills | Status: AC

## 2019-10-07 MED ORDER — PANTOPRAZOLE SODIUM 40 MG PO TBEC: 40.0000 mg | DELAYED_RELEASE_TABLET | Freq: Every day | ORAL | 0 refills | Status: AC

## 2019-10-07 NOTE — Plan of Care (Signed)
Pt. Completed all goals, ready for discharge

## 2019-10-07 NOTE — Discharge Instructions (Signed)
Person Under Monitoring Name: Donna Wheeler  Location: [place] [address]   Infection Prevention Recommendations for Individuals Confirmed to have, or Being Evaluated for, 2019 Novel Coronavirus (COVID-19) Infection Who Receive Care at Home  Individuals who are confirmed to have, or are being evaluated for, COVID-19 should follow the prevention steps below until a healthcare provider or local or state health department says they can return to normal activities.  Stay home except to get medical care You should restrict activities outside your home, except for getting medical care. Do not go to work, school, or public areas, and do not use public transportation or taxis.  Call ahead before visiting your doctor Before your medical appointment, call the healthcare provider and tell them that you have, or are being evaluated for, COVID-19 infection. This will help the healthcare providers office take steps to keep other people from getting infected. Ask your healthcare provider to call the local or state health department.  Monitor your symptoms Seek prompt medical attention if your illness is worsening (e.g., difficulty breathing). Before going to your medical appointment, call the healthcare provider and tell them that you have, or are being evaluated for, COVID-19 infection. Ask your healthcare provider to call the local or state health department.  Wear a facemask You should wear a facemask that covers your nose and mouth when you are in the same room with other people and when you visit a healthcare provider. People who live with or visit you should also wear a facemask while they are in the same room with you.  Separate yourself from other people in your home As much as possible, you should stay in a different room from other people in your home. Also, you should use a separate bathroom, if available.  Avoid sharing household items You should not share  dishes, drinking glasses, cups, eating utensils, towels, bedding, or other items with other people in your home. After using these items, you should wash them thoroughly with soap and water.  Cover your coughs and sneezes Cover your mouth and nose with a tissue when you cough or sneeze, or you can cough or sneeze into your sleeve. Throw used tissues in a lined trash can, and immediately wash your hands with soap and water for at least 20 seconds or use an alcohol-based hand rub.  Wash your [facility] your hands often and thoroughly with soap and water for at least 20 seconds. You can use an alcohol-based hand sanitizer if soap and water are not available and if your hands are not visibly dirty. Avoid touching your eyes, nose, and mouth with unwashed hands.   Prevention Steps for Caregivers and Household Members of Individuals Confirmed to have, or Being Evaluated for, COVID-19 Infection Being Cared for in the Home  If you live with, or provide care at home for, a person confirmed to have, or being evaluated for, COVID-19 infection please follow these guidelines to prevent infection:  Follow healthcare providers instructions Make sure that you understand and can help the patient follow any healthcare provider instructions for all care.  Provide for the patients basic needs You should help the patient with basic needs in the home and provide support for getting groceries, prescriptions, and other personal needs.  Monitor the patients symptoms If they are getting sicker, call his or her medical provider and tell them that the patient has, or is being evaluated for, COVID-19 infection. This will help the healthcare providers office  take steps to keep other people from getting infected. Ask the healthcare provider to call the local or state health department.  Limit the number of people who have contact with the patient  If possible, have only one caregiver for the patient.  Other  household members should stay in another home or place of residence. If this is not possible, they should stay  in another room, or be separated from the patient as much as possible. Use a separate bathroom, if available.  Restrict visitors who do not have an essential need to be in the home.  Keep older adults, very young children, and other sick people away from the patient Keep older adults, very young children, and those who have compromised immune systems or chronic health conditions away from the patient. This includes people with chronic heart, lung, or kidney conditions, diabetes, and cancer.  Ensure good ventilation Make sure that shared spaces in the home have good air flow, such as from an air conditioner or an opened window, weather permitting.  Wash your hands often  Wash your hands often and thoroughly with soap and water for at least 20 seconds. You can use an alcohol based hand sanitizer if soap and water are not available and if your hands are not visibly dirty.  Avoid touching your eyes, nose, and mouth with unwashed hands.  Use disposable paper towels to dry your hands. If not available, use dedicated cloth towels and replace them when they become wet.  Wear a facemask and gloves  Wear a disposable facemask at all times in the room and gloves when you touch or have contact with the patients blood, body fluids, and/or secretions or excretions, such as sweat, saliva, sputum, nasal mucus, vomit, urine, or feces.  Ensure the mask fits over your nose and mouth tightly, and do not touch it during use.  Throw out disposable facemasks and gloves after using them. Do not reuse.  Wash your hands immediately after removing your facemask and gloves.  If your personal clothing becomes contaminated, carefully remove clothing and launder. Wash your hands after handling contaminated clothing.  Place all used disposable facemasks, gloves, and other waste in a lined container before  disposing them with other household waste.  Remove gloves and wash your hands immediately after handling these items.  Do not share dishes, glasses, or other household items with the patient  Avoid sharing household items. You should not share dishes, drinking glasses, cups, eating utensils, towels, bedding, or other items with a patient who is confirmed to have, or being evaluated for, COVID-19 infection.  After the person uses these items, you should wash them thoroughly with soap and water.  Wash laundry thoroughly  Immediately remove and wash clothes or bedding that have blood, body fluids, and/or secretions or excretions, such as sweat, saliva, sputum, nasal mucus, vomit, urine, or feces, on them.  Wear gloves when handling laundry from the patient.  Read and follow directions on labels of laundry or clothing items and detergent. In general, wash and dry with the warmest temperatures recommended on the label.  Clean all areas the individual has used often  Clean all touchable surfaces, such as counters, tabletops, doorknobs, bathroom fixtures, toilets, phones, keyboards, tablets, and bedside tables, every day. Also, clean any surfaces that may have blood, body fluids, and/or secretions or excretions on them.  Wear gloves when cleaning surfaces the patient has come in contact with.  Use a diluted bleach solution (e.g., dilute bleach with 1 part  bleach and 10 parts water) or a household disinfectant with a label that says EPA-registered for coronaviruses. To make a bleach solution at home, add 1 tablespoon of bleach to 1 quart (4 cups) of water. For a larger supply, add  cup of bleach to 1 gallon (16 cups) of water.  Read labels of cleaning products and follow recommendations provided on product labels. Labels contain instructions for safe and effective use of the cleaning product including precautions you should take when applying the product, such as wearing gloves or eye protection  and making sure you have good ventilation during use of the product.  Remove gloves and wash hands immediately after cleaning.  Monitor yourself for signs and symptoms of illness Caregivers and household members are considered close contacts, should monitor their health, and will be asked to limit movement outside of the home to the extent possible. Follow the monitoring steps for close contacts listed on the symptom monitoring form.   ? If you have additional questions, contact your local health department or call the epidemiologist on call at [phone] (available 24/7). ? This guidance is subject to change. For the most up-to-date guidance from [facility], please refer to their website: [email or web address]

## 2019-10-07 NOTE — Discharge Summary (Signed)
Donna Wheeler, is a 58 y.o. female  DOB 1961-08-05  MRN [id number].  Admission date:  10/02/2019  Admitting Physician  Eduard ClosArshad N Kakrakandy, MD  Discharge Date:  10/07/2019   Primary MD  Laurann MontanaWhite, Cynthia, MD  Recommendations for primary care physician for things to follow:  -Please check CBC, CMP during next visit  Admission Diagnosis  Dehydration [E86.0]   Discharge Diagnosis  Dehydration [E86.0]    Principal Problem:   COVID-19 virus infection Active Problems:   Hypothyroid   Diarrhea   Dehydration   Hypokalemia      Past Medical History:  Diagnosis Date   Anxiety    Depression    Hypothyroid     Past Surgical History:  Procedure Laterality Date   CESAREAN SECTION  1993       History of present illness and  Hospital Course:     Kindly see H&P for history of present illness and admission details, please review complete Labs, Consult reports and Test reports for all details in brief  HPI  from the history and physical done on the day of admission 10/03/2019 HPI: Donna BongoSusan L Wheeler is a 58 y.o. female with history of hypothyroidism who was diagnosed with COVID 19 about a week ago has been having persistent vomiting and diarrhea over the last 1 week.  Patient has been also was diagnosed with COVID-19 and is [facility].  Patient states he initial symptom was persistent cough and this made her go to her primary care who tested for COVID-19 and was positive.  Over the last few days patient has become progressively weak.  Persistent cough and had at least 2-3 episodes of diarrhea but denies any abdominal pain.  ED Course: In the ER labs show potassium 2.9 sodium 134 creatinine 1.7 albumin 3.1 AST 47 LDH 265 triglyceride 178 ferritin 966 CRP 22.6 procalcitonin 1.58 lactic acid 0.8 platelets 181 fibrinogen more than 800 KUB does not show any acute.  Abdomen appears  benign.  Chest x-ray shows multifocal pneumonia.  Patient admitted for COVID-19 infection with diarrhea and pneumonia.  While in the ER patient started having chills and rigors but temperature was around 99 F.  Blood cultures obtained.  Patient blood pressure initially was in the low normal.  Improved with fluids.  Potassium replacement was given.   Hospital Course    58 year old female with history of hypothyroidism presented to the hospital and was admitted on 10/02/2019 with a week of GI symptoms of nausea, vomiting, diarrhea, weakness, as well as progressive cough and subjective shortness of breath.  Chest x-ray on admission showed multifocal pneumonia.  She was not hypoxic and on room air however her labs show significant elevation in her inflammatory markers   Covid-19 Viral Illness /COVID-19 pneumonia -Mainly with generalized weakness and GI symptoms, has significantly improved . -  Given multifocal infiltrates on chest x-ray she was started on Remdesivir which she completed for 5 days, -s/p plasma 10/18 -Inflammatory markers were significantly elevated initially ,CRP significantly elevated  at 22.6, this has improved, it is 2.3 on discharge  COVID-19 Labs   Recent Labs    10/05/19 0420 10/06/19 0410 10/07/19 0430  DDIMER 0.56* 0.56*  --   CRP 7.7* 4.3* 2.3*    Recent Labs (last 2 labs)        Recent Labs    10/04/19 0454 10/05/19 0420 10/06/19 0410  DDIMER 0.81* 0.56* 0.56*  CRP 16.7* 7.7* 4.3*      Recent Labs       Lab Results  Component Value Date   SARSCOV2NAA POSITIVE (A) 10/02/2019      Dehydration/hypokalemia due to COVID-19 GI illness -Hypokalemia resolved with potassium supplementation.  LFT elevation -Likely in the setting of viral illness/Remdesivir use.   stable, repeat CMP as an outpatient  Hypothyroidism -Continue home Synthroid    Discharge Condition:  stable  Follow UP  Follow-up Information    Laurann Montana, MD Follow up  in 2 week(s).   Specialty: Family Medicine Contact information: [address] [place] [address] [phone]             Discharge Instructions  and  Discharge Medications     Discharge Instructions    Discharge instructions   Complete by: As directed    Follow with Primary MD Laurann Montana, MD in 14 days   Get CBC, CMP,  checked  by Primary MD next visit.    Activity: As tolerated with Full fall precautions use walker/cane & assistance as needed   Disposition Home    Diet: Heart Healthy   On your next visit with your primary care physician please Get Medicines reviewed and adjusted.   Please request your Prim.MD to go over all Hospital Tests and Procedure/Radiological results at the follow up, please get all Hospital records sent to your Prim MD by signing hospital release before you go home.   If you experience worsening of your admission symptoms, develop shortness of breath, life threatening emergency, suicidal or homicidal thoughts you must seek medical attention immediately by calling 911 or calling your MD immediately  if symptoms less severe.  You Must read complete instructions/literature along with all the possible adverse reactions/side effects for all the Medicines you take and that have been prescribed to you. Take any new Medicines after you have completely understood and accpet all the possible adverse reactions/side effects.   Do not drive, operating heavy machinery, perform activities at heights, swimming or participation in water activities or provide baby sitting services if your were admitted for syncope or siezures until you have seen by Primary MD or a Neurologist and advised to do so again.  Do not drive when taking Pain medications.    Do not take more than prescribed Pain, Sleep and Anxiety Medications  Special Instructions: If you have smoked or chewed Tobacco  in the last 2 yrs please stop smoking, stop any regular  Alcohol  and or any Recreational drug use.  Wear Seat belts while driving.   Please note  You were cared for by a hospitalist during your hospital stay. If you have any questions about your discharge medications or the care you received while you were in the hospital after you are discharged, you can call the unit and asked to speak with the hospitalist on call if the hospitalist that took care of you is not available. Once you are discharged, your primary care physician will handle any further medical issues. Please note that NO REFILLS for any discharge  medications will be authorized once you are discharged, as it is imperative that you return to your primary care physician (or establish a relationship with a primary care physician if you do not have one) for your aftercare needs so that they can reassess your need for medications and monitor your lab values.   Increase activity slowly   Complete by: As directed      Allergies as of 10/07/2019      Reactions   Penicillins Itching      Medication List    TAKE these medications   escitalopram 20 MG tablet Commonly known as: LEXAPRO Take 20 mg by mouth daily.   levothyroxine 112 MCG tablet Commonly known as: SYNTHROID TAKE 1 TABLET EVERY DAY What changed:   how much to take  how to take this  when to take this  additional instructions   pantoprazole 40 MG tablet Commonly known as: Protonix Take 1 tablet (40 mg total) by mouth daily for 15 days.   predniSONE 10 MG (21) Tbpk tablet Commonly known as: STERAPRED UNI-PAK 21 TAB Use per package instructions         Diet and Activity recommendation: See Discharge Instructions above   Consults obtained -  None  Major procedures and Radiology Reports - PLEASE review detailed and final reports for all details, in brief -     Dg Chest Port 1 View  Result Date: 10/02/2019 CLINICAL DATA:  Cough, shortness of breath, covered positive EXAM: PORTABLE CHEST 1 VIEW  COMPARISON:  None. FINDINGS: Multifocal areas of interstitial and airspace opacity throughout both lungs most pronounced in the right lung periphery. No pneumothorax or effusion. The cardiomediastinal contours are unremarkable. Degenerative changes are present in the imaged spine and shoulders. No acute osseous or soft tissue abnormality. Coil projects over the left mid lung, likely fiducial marker. IMPRESSION: Multifocal areas of interstitial and airspace opacity throughout both lungs, most pronounced in the right lung periphery, compatible with multifocal pneumonia including COVID-19 etiology. Electronically Signed   By: Kreg Shropshire M.D.   On: 10/02/2019 20:38   Dg Abd Portable 1 View  Result Date: 10/02/2019 CLINICAL DATA:  Diarrhea EXAM: PORTABLE ABDOMEN - 1 VIEW COMPARISON:  None. FINDINGS: The bowel gas pattern is normal. No radio-opaque calculi or other significant radiographic abnormality are seen. IMPRESSION: Negative. Electronically Signed   By: Duanne Guess M.D.   On: 10/02/2019 20:38    Micro Results    Recent Results (from the past 240 hour(s))  Blood Culture (routine x 2)     Status: None   Collection Time: 10/02/19  9:02 PM   Specimen: BLOOD RIGHT FOREARM  Result Value Ref Range Status   Specimen Description   Final    BLOOD RIGHT FOREARM Performed at [facility], [address], [place], [place] [address]    Special Requests   Final    BOTTLES DRAWN AEROBIC AND ANAEROBIC Blood Culture adequate volume Performed at [facility], [address] [place], [place] [address]    Culture   Final    NO GROWTH 5 DAYS Performed at [facility], [address], [place], [place] [address]    Report Status 10/07/2019 FINAL  Final  Blood Culture (routine x 2)     Status: None   Collection Time: 10/02/19  9:02 PM   Specimen: BLOOD  Result Value Ref Range Status   Specimen Description   Final    BLOOD LEFT WRIST Performed at [facility]  [place] [address], [place], [place] [address]    Special Requests   Final    BOTTLES DRAWN AEROBIC AND ANAEROBIC Blood Culture adequate volume Performed at [facility] [address], [place], [place] [address]    Culture   Final    NO GROWTH 5 DAYS Performed at [facility], [place] [address] [place], [place] [address]    Report Status 10/07/2019 FINAL  Final  SARS CORONAVIRUS 2 (TAT 6-24 HRS) Nasopharyngeal Nasopharyngeal Swab     Status: Abnormal   Collection Time: 10/02/19  9:02 PM   Specimen: Nasopharyngeal Swab  Result Value Ref Range Status   SARS Coronavirus 2 POSITIVE (A) NEGATIVE Final    Comment: RESULT CALLED TO, READ BACK BY AND VERIFIED WITH: Kaisyn CLAPP [email or web address].@[email or web address]  ON 10.17.2020 BY TCALDWELL MT. (NOTE) SARS-CoV-2 target nucleic acids are DETECTED. The SARS-CoV-2 RNA is generally detectable in upper and lower respiratory specimens during the acute phase of infection. Positive results are indicative of active infection with SARS-CoV-2. Clinical  correlation with patient history and other diagnostic information is necessary to determine patient infection status. Positive results do  not rule out bacterial infection or co-infection with other viruses. The expected result is Negative. Fact Sheet for Patients: SugarRoll.be Fact Sheet for Healthcare Providers: https://www.woods-mathews.com/ This test is not yet approved or cleared by the [place] FDA and  has been authorized for detection and/or diagnosis of SARS-CoV-2 by FDA under an Emergency Use Authorization (EUA). This EUA will remain  in effect (meaning this test ca n be used) for the duration of the COVID-19 declaration under Section 564(b)(1) of the Act, 21 U.S.C. section 360bbb-3(b)(1), unless the authorization is terminated or revoked sooner. Performed at [facility], [place] [address] [place],  [address]          Today   Subjective:   Alyene Predmore today has no headache,no chest abdominal pain,no new weakness tingling or numbness, feels much better wants to go home today.   Objective:   Blood pressure (!) 145/82, pulse (!) 51, temperature 98.6 F (37 C), temperature source Oral, resp. rate 16, height 5\' 5"  (1.651 m), weight 77.6 kg, SpO2 98 %.   Intake/Output Summary (Last 24 hours) at 10/07/2019 1042 Last data filed at 10/06/2019 2300 Gross per 24 hour  Intake 250 ml  Output --  Net 250 ml    Exam Awake Alert, Oriented x 3, No new F.N deficits, Normal affect Symmetrical Chest wall movement, Good air movement bilaterally, CTAB RRR,No Gallops,Rubs or new Murmurs, No Parasternal Heave +ve B.Sounds, Abd Soft, Non tender,No rebound -guarding or rigidity. No Cyanosis, Clubbing or edema, No new Rash or bruise  Data Review   CBC w Diff:  Lab Results  Component Value Date   WBC 8.6 10/07/2019   HGB 11.6 (L) 10/07/2019   HCT 33.6 (L) 10/07/2019   PLT 313 10/07/2019   LYMPHOPCT 11 10/07/2019   MONOPCT 5 10/07/2019   EOSPCT 0 10/07/2019   BASOPCT 0 10/07/2019    CMP:  Lab Results  Component Value Date   NA 136 10/07/2019   K 3.7 10/07/2019   CL 101 10/07/2019   CO2 24 10/07/2019   BUN 18 10/07/2019   CREATININE 0.60 10/07/2019   CREATININE 0.87 11/04/2018   PROT 6.5 10/07/2019   ALBUMIN 3.0 (L) 10/07/2019   BILITOT 1.0 10/07/2019   ALKPHOS 161 (H) 10/07/2019   AST 35 10/07/2019   ALT 61 (H) 10/07/2019  .  Total Time in preparing paper work, data evaluation and todays exam - 35 minutes  Huey Bienenstock M.D on 10/07/2019 at 10:42 AM  [facility]   Office  [phone]

## 2019-10-07 NOTE — Plan of Care (Signed)

## 2019-10-07 NOTE — Progress Notes (Signed)
Patient received written and verbal discharge instructions. All questions answered, patient will follow up with primary care doctor. Patient to be transported in private vehicle.

## 2019-10-18 IMAGING — MG DIGITAL SCREENING BILATERAL MAMMOGRAM WITH CAD
4 series · 4 of 4 positions shown · non-contrast
Comparison: Previous exam(s).

ACR Breast Density Category a: The breast tissue is almost entirely
fatty.

CLINICAL DATA: Screening.

EXAM:
DIGITAL SCREENING BILATERAL MAMMOGRAM WITH CAD

[R MLO]
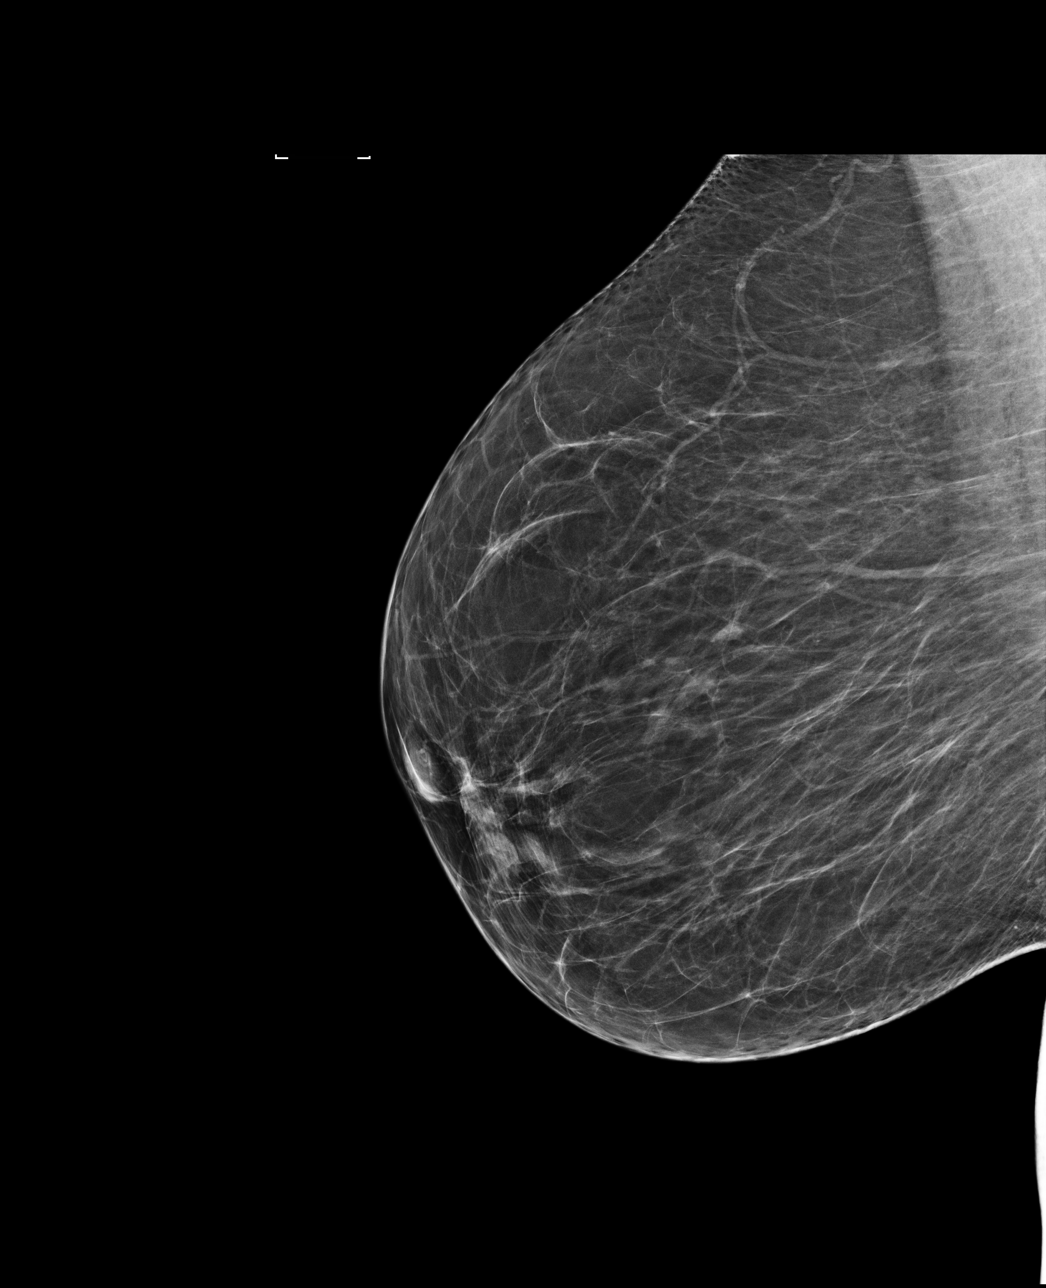

[L CC]
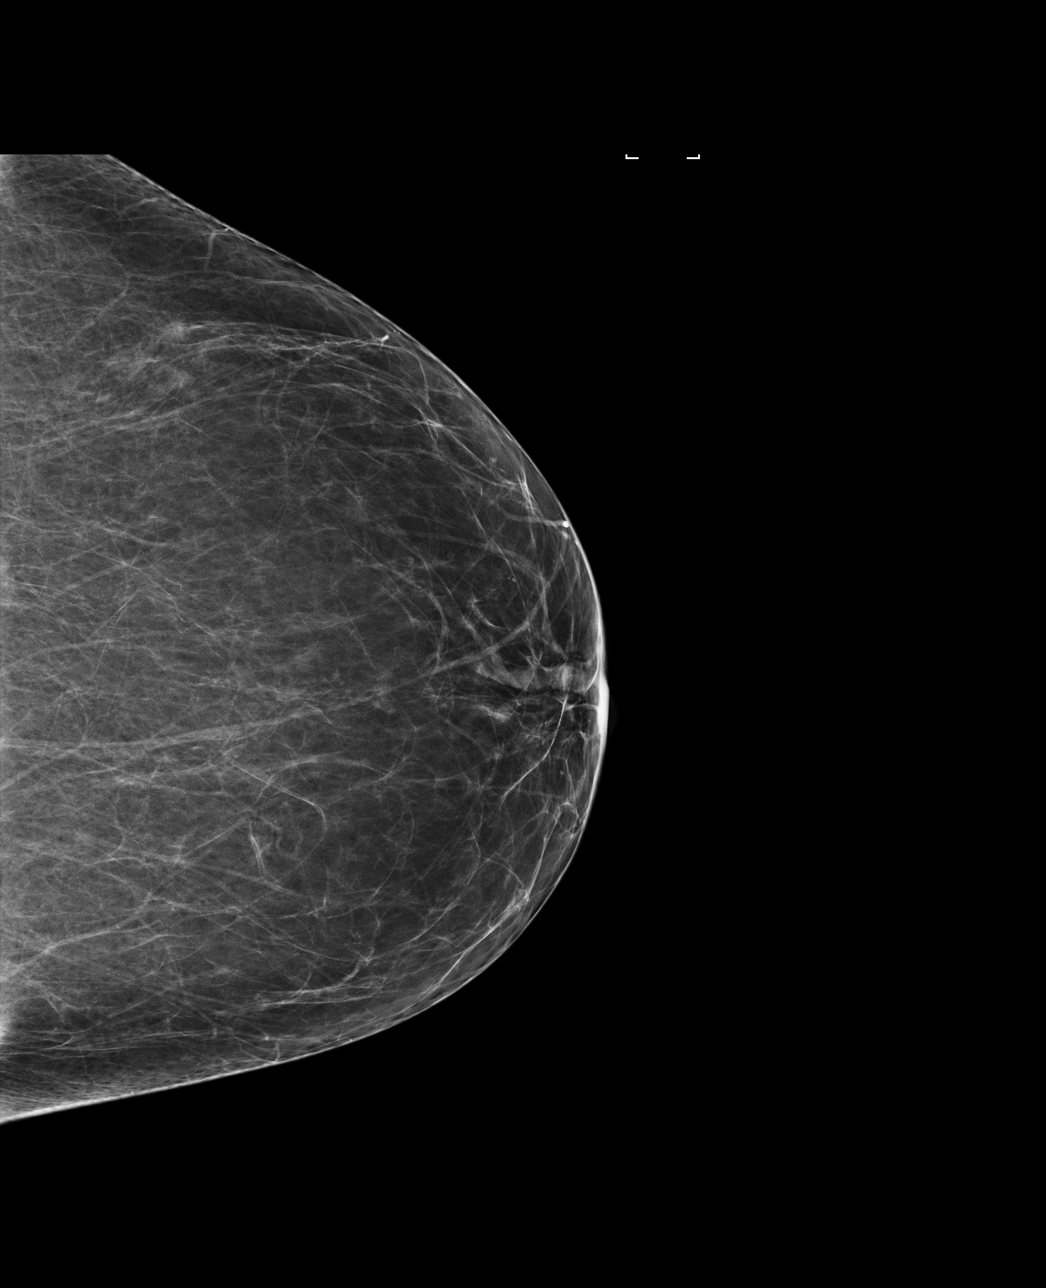

[L MLO]
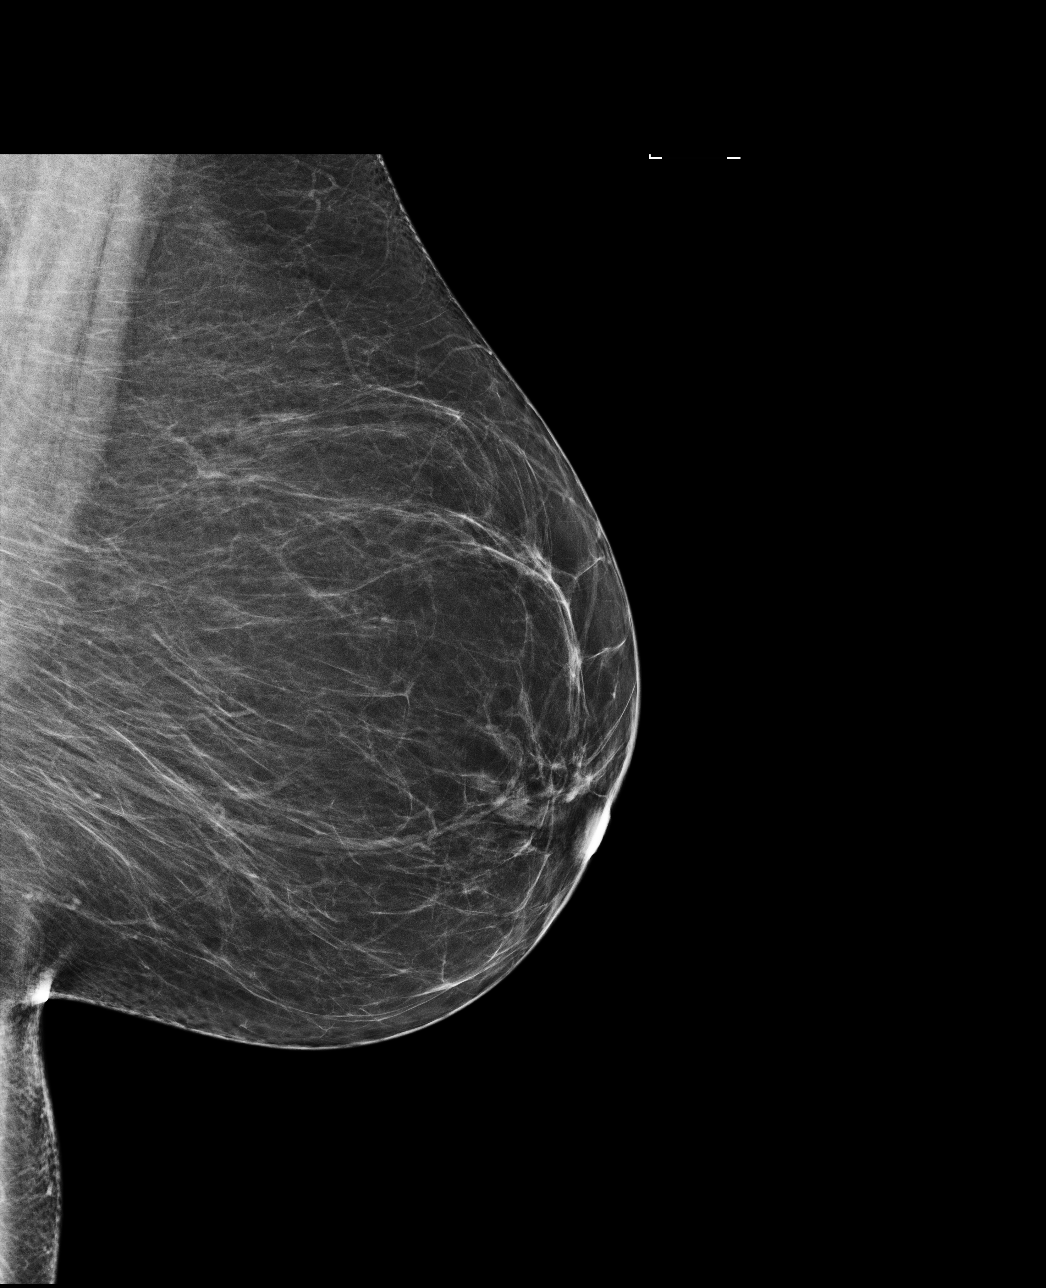

[R CC]
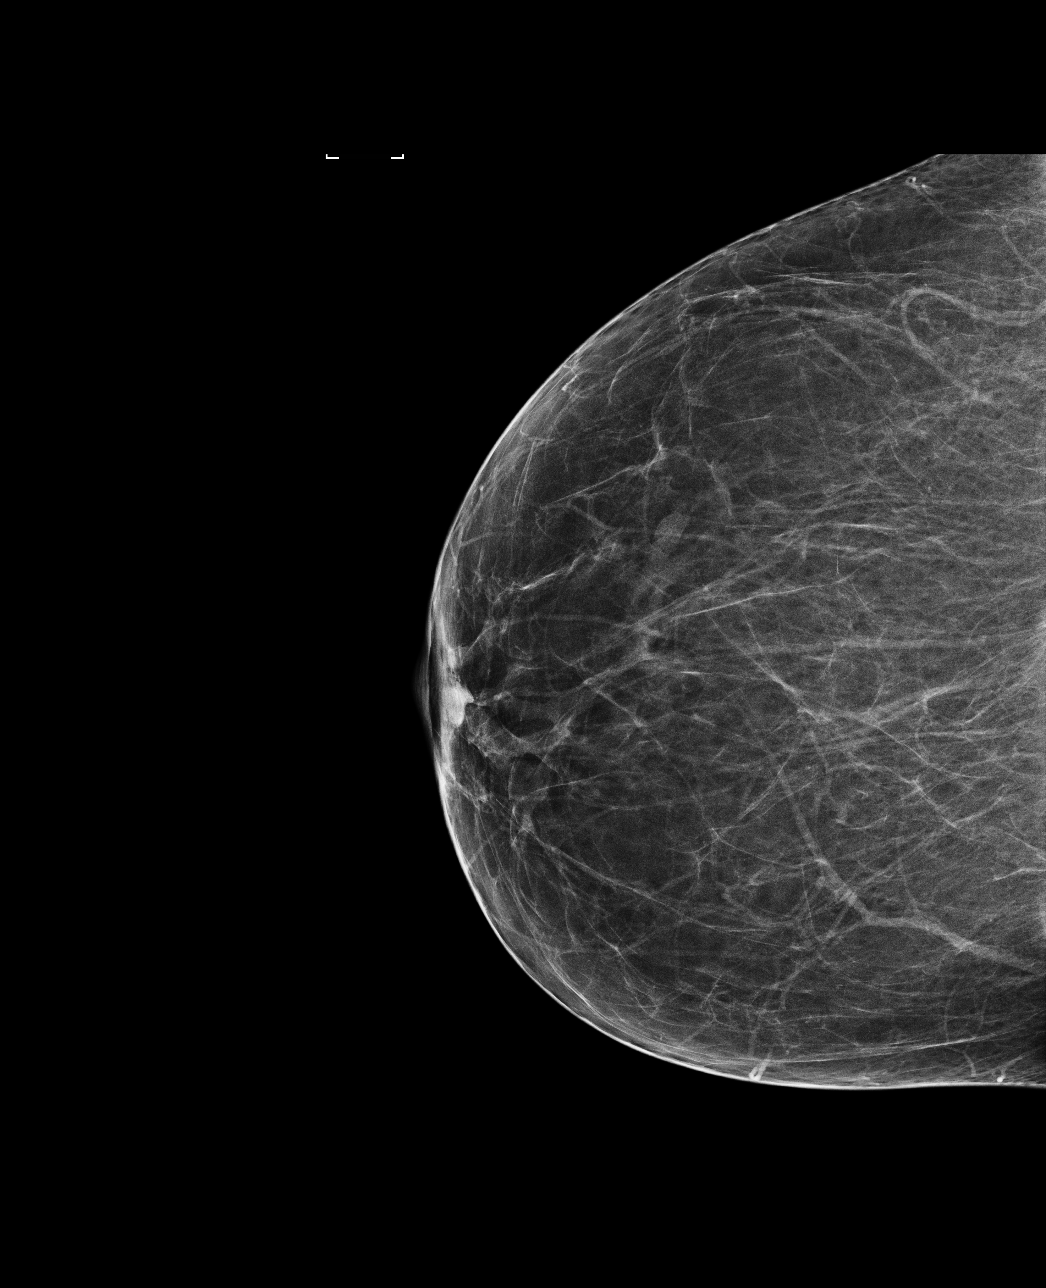

[4 of 4 positions shown; findings below may reference images not displayed]

FINDINGS: There are no findings suspicious for malignancy. Images were
processed with CAD.
IMPRESSION: No mammographic evidence of malignancy. A result letter of this
screening mammogram will be mailed directly to the patient.

RECOMMENDATION:
Screening mammogram in one year. (Code:[id number])

BI-RADS CATEGORY  1: Negative.

## 2019-11-05 ENCOUNTER — Other Ambulatory Visit: Payer: Self-pay

## 2020-06-06 ENCOUNTER — Other Ambulatory Visit: Payer: Self-pay | Admitting: Family Medicine

## 2020-06-06 ENCOUNTER — Ambulatory Visit
Admission: RE | Admit: 2020-06-06 | Discharge: 2020-06-06 | Disposition: A | Discharge status: Home / Self Care | Payer: Managed Care, Other (non HMO) | Source: Ambulatory Visit | Attending: Family Medicine | Admitting: Family Medicine

## 2020-06-06 DIAGNOSIS — R059 Cough, unspecified: Secondary | ICD-10-CM

## 2020-10-16 IMAGING — DX DG CHEST 1V PORT
1 series · 1 of 1 positions shown · non-contrast
Comparison: None.

CLINICAL DATA: Cough, shortness of breath, covered positive

EXAM:
PORTABLE CHEST 1 VIEW

[chest ap]
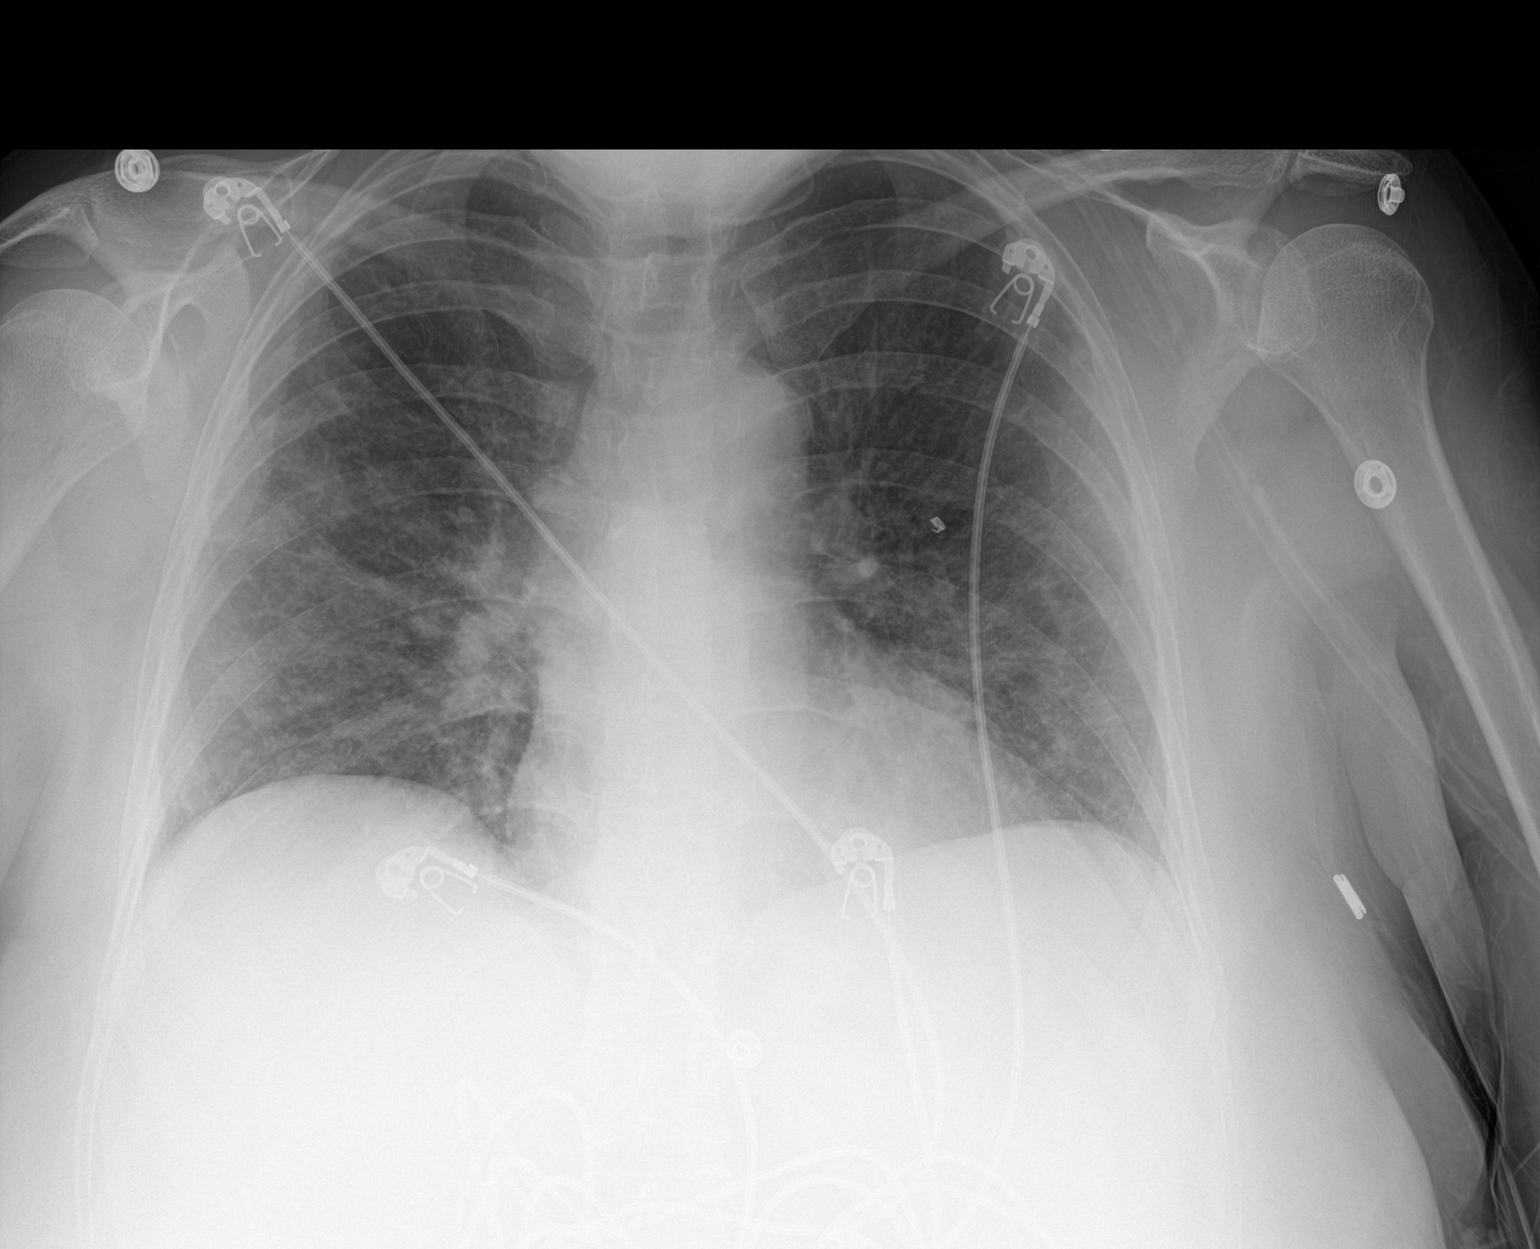

[1 of 1 positions shown; findings below may reference images not displayed]

FINDINGS: Multifocal areas of interstitial and airspace opacity throughout
both lungs most pronounced in the right lung periphery. No
pneumothorax or effusion. The cardiomediastinal contours are
unremarkable. Degenerative changes are present in the imaged spine
and shoulders. No acute osseous or soft tissue abnormality. Coil
projects over the left mid lung, likely fiducial marker.
IMPRESSION: Multifocal areas of interstitial and airspace opacity throughout
both lungs, most pronounced in the right lung periphery, compatible
with multifocal pneumonia including 3VYWR-PT etiology.

## 2020-10-16 IMAGING — DX DG ABD PORTABLE 1V
1 series · 1 of 1 positions shown · non-contrast
Comparison: None.

CLINICAL DATA: Diarrhea

EXAM:
PORTABLE ABDOMEN - 1 VIEW

[abdomen kub]
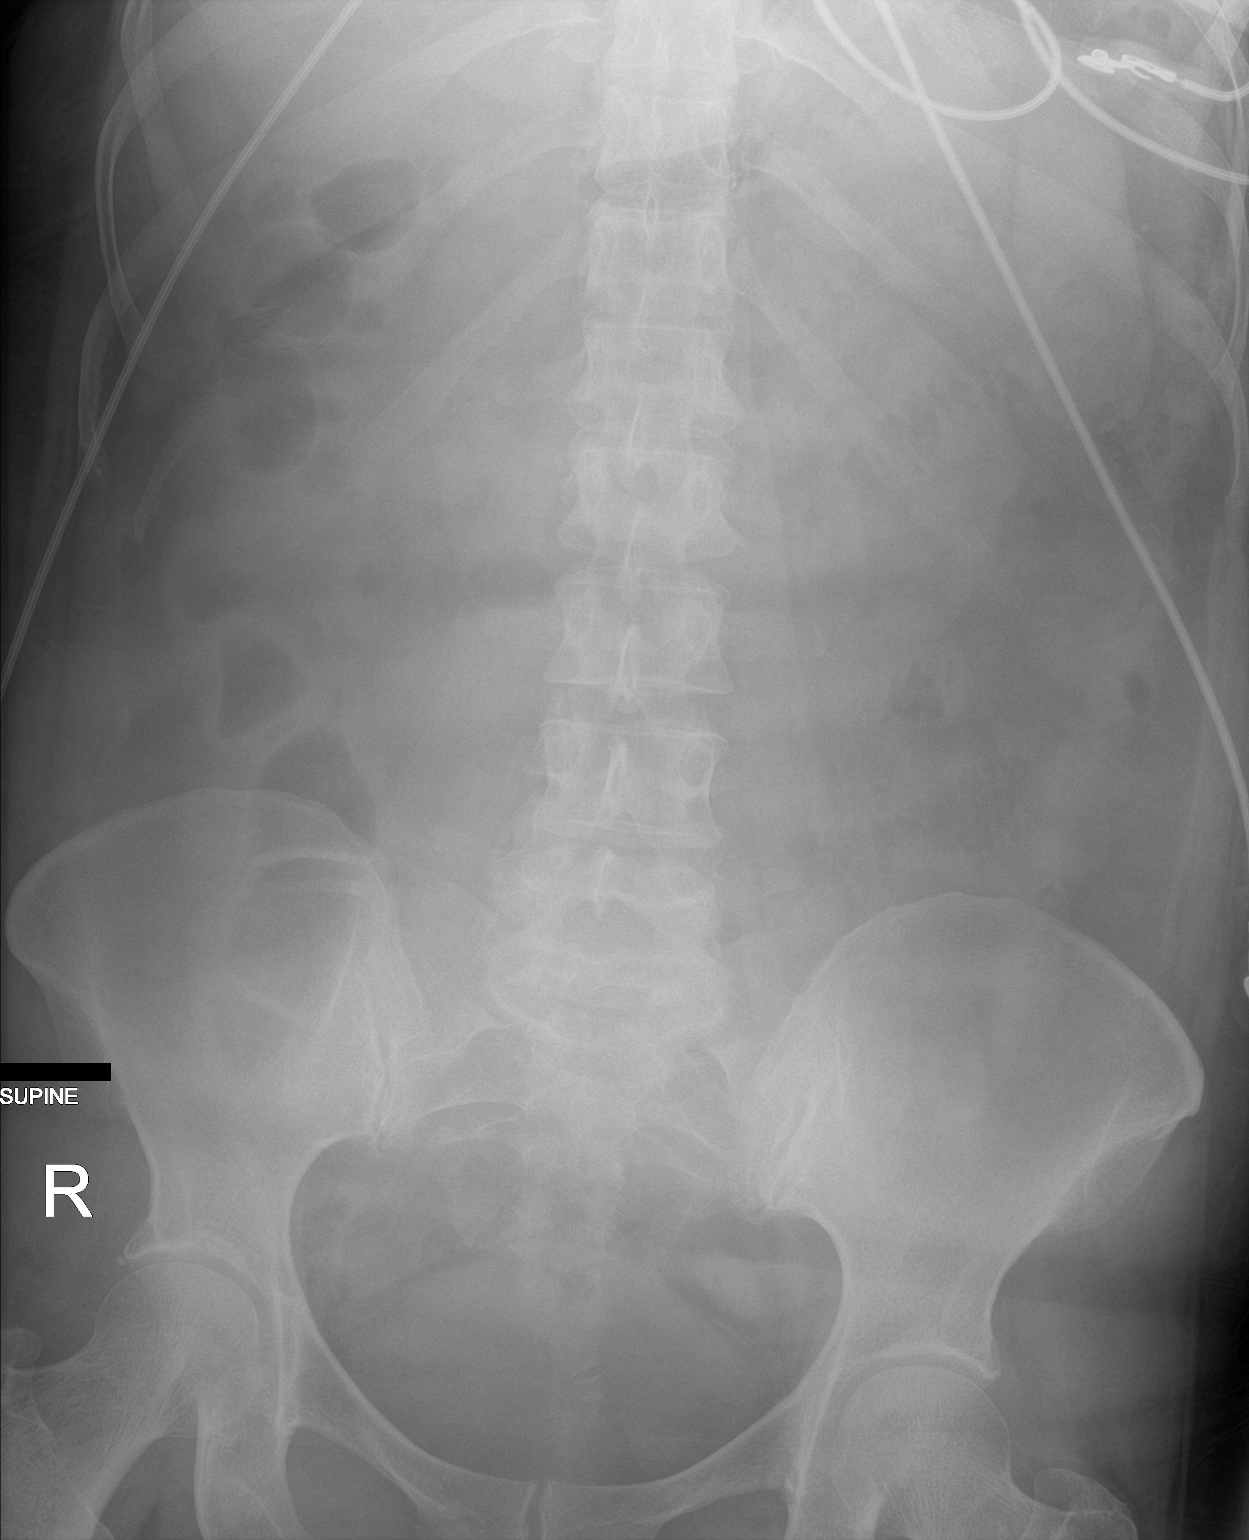

[1 of 1 positions shown; findings below may reference images not displayed]

FINDINGS: The bowel gas pattern is normal. No radio-opaque calculi or other
significant radiographic abnormality are seen.
IMPRESSION: Negative.

## 2021-06-21 IMAGING — CR DG CHEST 2V
2 series · 2 of 2 positions shown · non-contrast
Comparison: Chest x-ray 10/02/2019.

CLINICAL DATA: 58-year-old female with history of chronic
productive cough for the past 3 years.

EXAM:
CHEST - 2 VIEW

[w chest pa]
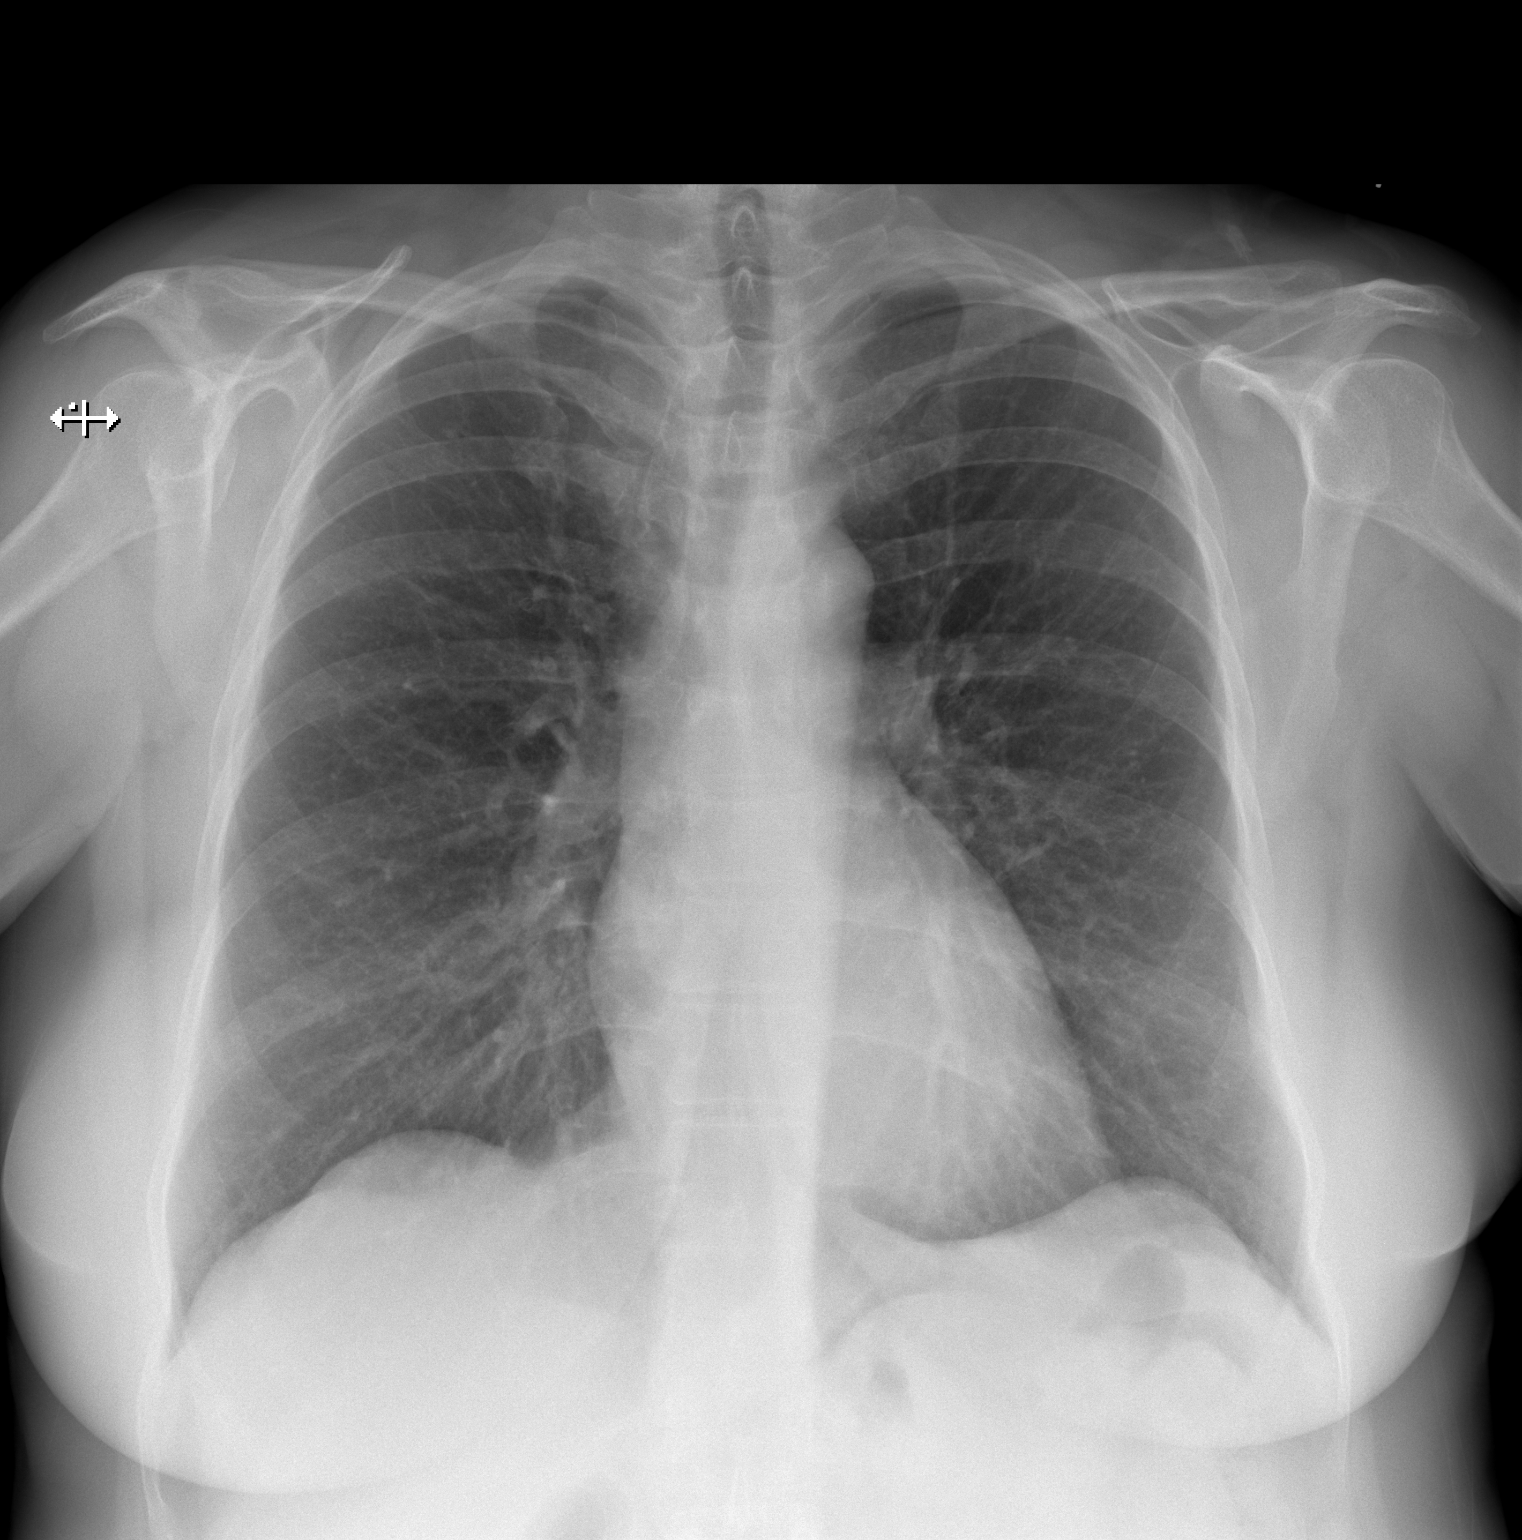

[w chest lat]
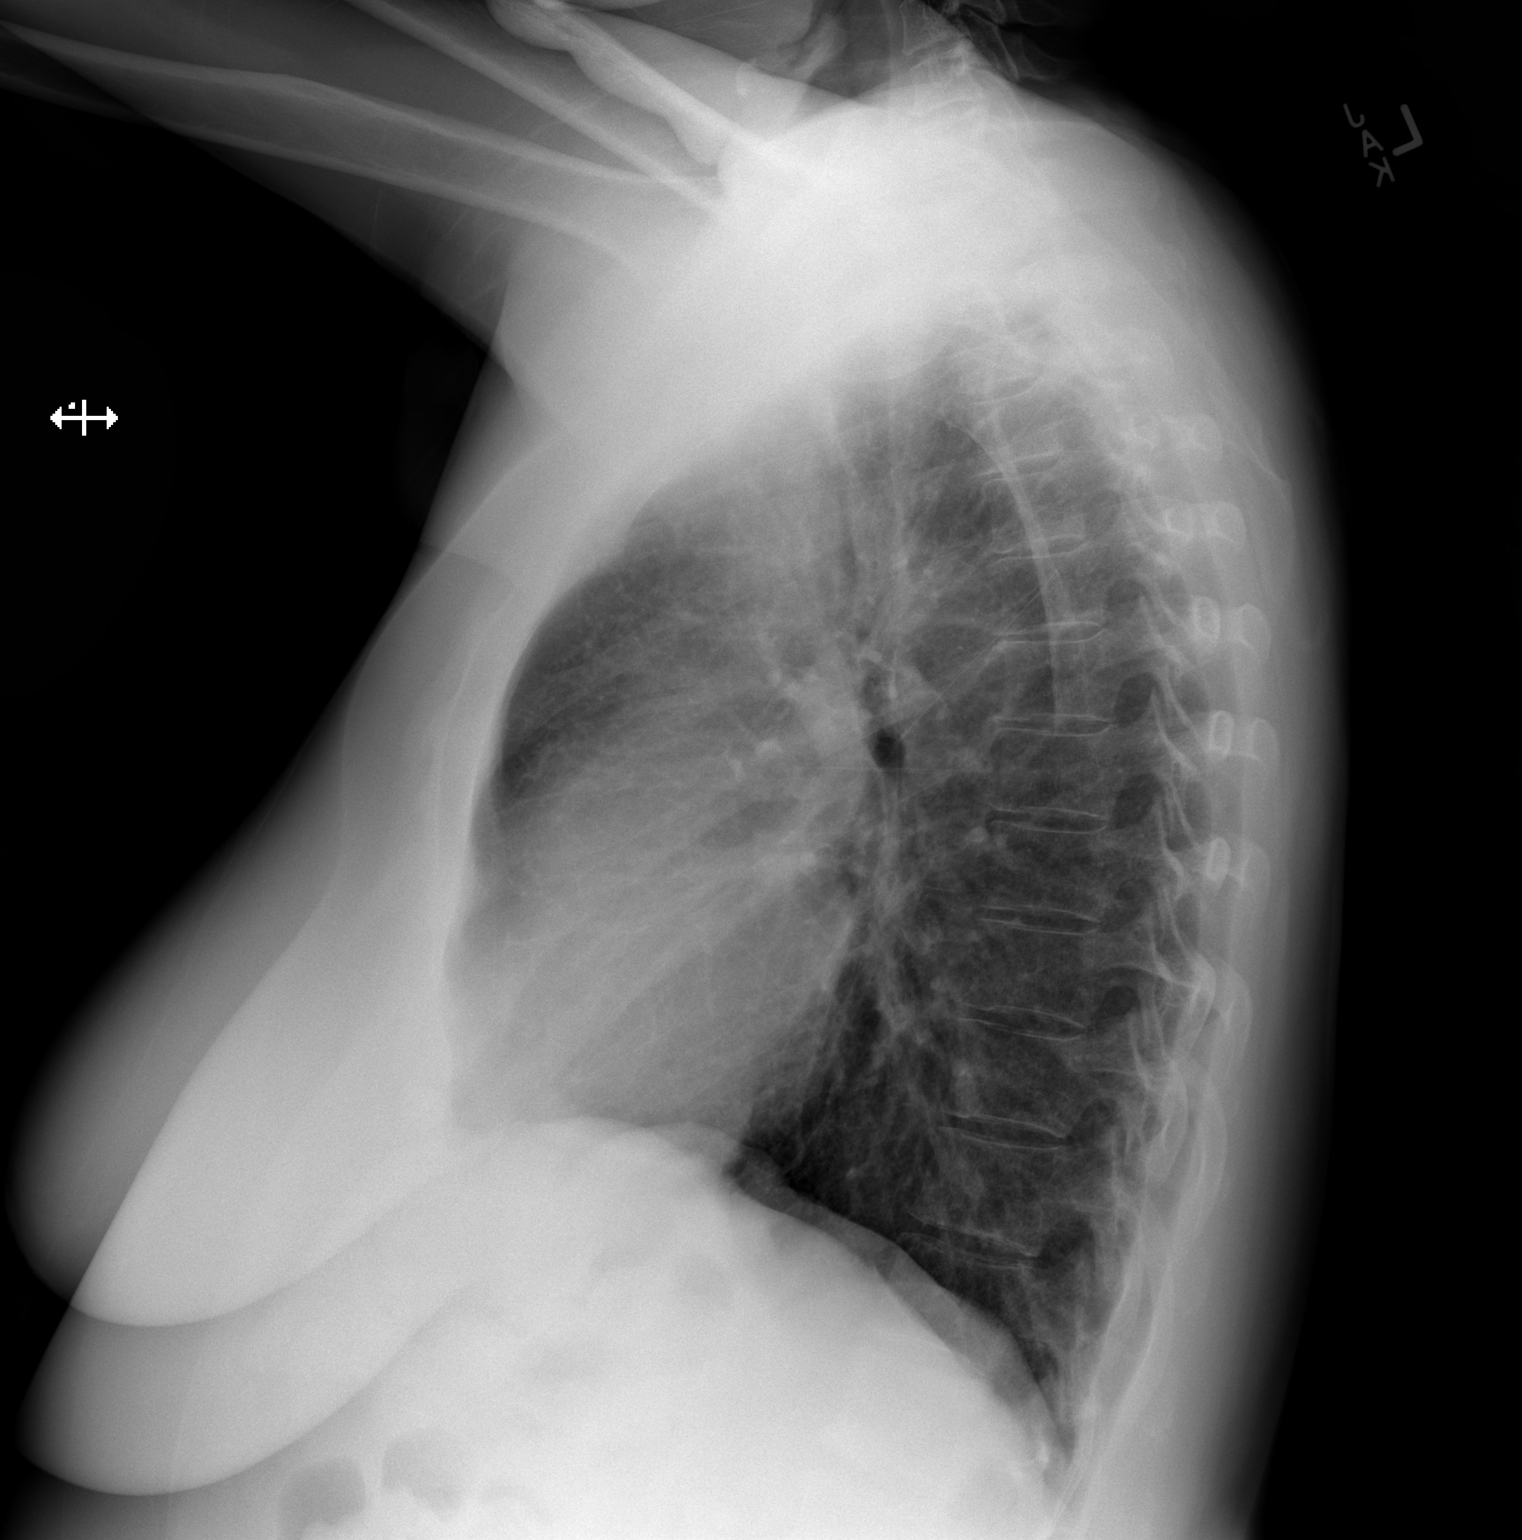

[2 of 2 positions shown; findings below may reference images not displayed]

FINDINGS: Lung volumes are normal. No consolidative airspace disease. No
pleural effusions. No pneumothorax. No pulmonary nodule or mass
noted. Pulmonary vasculature and the cardiomediastinal silhouette
are within normal limits.
IMPRESSION: No radiographic evidence of acute cardiopulmonary disease.

## 2023-09-27 ENCOUNTER — Other Ambulatory Visit: Payer: Self-pay | Admitting: Family Medicine

## 2023-09-27 ENCOUNTER — Ambulatory Visit
Admission: RE | Admit: 2023-09-27 | Discharge: 2023-09-27 | Disposition: A | Discharge status: Home / Self Care | Payer: Managed Care, Other (non HMO) | Source: Ambulatory Visit | Attending: Family Medicine | Admitting: Family Medicine

## 2023-09-27 DIAGNOSIS — R053 Chronic cough: Secondary | ICD-10-CM

## 2024-01-01 ENCOUNTER — Institutional Professional Consult (permissible substitution): Payer: Managed Care, Other (non HMO) | Admitting: Pulmonary Disease
